# Patient Record
Sex: Female | Born: 1955
Health system: Southern US, Community
[De-identification: ages and names within clinical notes are randomized; demographics above are authoritative.]

## PROBLEM LIST (undated history)

## (undated) DIAGNOSIS — G8929 Other chronic pain: Secondary | ICD-10-CM

## (undated) DIAGNOSIS — F32A Depression, unspecified: Secondary | ICD-10-CM

## (undated) DIAGNOSIS — Z98811 Dental restoration status: Secondary | ICD-10-CM

## (undated) DIAGNOSIS — Z8041 Family history of malignant neoplasm of ovary: Secondary | ICD-10-CM

## (undated) DIAGNOSIS — M545 Low back pain, unspecified: Secondary | ICD-10-CM

## (undated) DIAGNOSIS — F329 Major depressive disorder, single episode, unspecified: Secondary | ICD-10-CM

## (undated) DIAGNOSIS — M069 Rheumatoid arthritis, unspecified: Secondary | ICD-10-CM

## (undated) DIAGNOSIS — M25551 Pain in right hip: Secondary | ICD-10-CM

## (undated) DIAGNOSIS — C44310 Basal cell carcinoma of skin of unspecified parts of face: Secondary | ICD-10-CM

## (undated) DIAGNOSIS — R635 Abnormal weight gain: Secondary | ICD-10-CM

## (undated) DIAGNOSIS — Z9289 Personal history of other medical treatment: Secondary | ICD-10-CM

## (undated) DIAGNOSIS — Q782 Osteopetrosis: Secondary | ICD-10-CM

## (undated) DIAGNOSIS — M1990 Unspecified osteoarthritis, unspecified site: Secondary | ICD-10-CM

## (undated) DIAGNOSIS — M810 Age-related osteoporosis without current pathological fracture: Secondary | ICD-10-CM

## (undated) HISTORY — DX: Personal history of other medical treatment: Z92.89

## (undated) HISTORY — DX: Abnormal weight gain: R63.5

## (undated) HISTORY — DX: Osteopetrosis: Q78.2

## (undated) HISTORY — DX: Pain in right hip: M25.551

## (undated) HISTORY — DX: Age-related osteoporosis without current pathological fracture: M81.0

## (undated) HISTORY — PX: BASAL CELL CARCINOMA EXCISION: SHX1214

## (undated) HISTORY — DX: Rheumatoid arthritis, unspecified: M06.9

## (undated) HISTORY — DX: Depression, unspecified: F32.A

## (undated) HISTORY — DX: Family history of malignant neoplasm of ovary: Z80.41

## (undated) HISTORY — DX: Major depressive disorder, single episode, unspecified: F32.9

## (undated) HISTORY — PX: JOINT REPLACEMENT: SHX530

---

## 2006-03-22 ENCOUNTER — Emergency Department: Payer: Self-pay | Admitting: Emergency Medicine

## 2006-04-17 ENCOUNTER — Ambulatory Visit: Payer: Self-pay

## 2006-05-02 ENCOUNTER — Ambulatory Visit: Payer: Self-pay

## 2007-11-12 ENCOUNTER — Ambulatory Visit: Payer: Self-pay | Admitting: Gastroenterology

## 2008-03-30 IMAGING — CT CT STONE STUDY
1 of 2 series · 15 of 32 positions shown, 19 images · non-contrast
Comparison: none

REASON FOR EXAM: side pain
COMMENTS:

[Series 2: stone · axial · 0.69mm/px · z∈[-982,-607]mm · 15 of 141 slices shown, 19 images]
[im 11/141  soft-tissue]
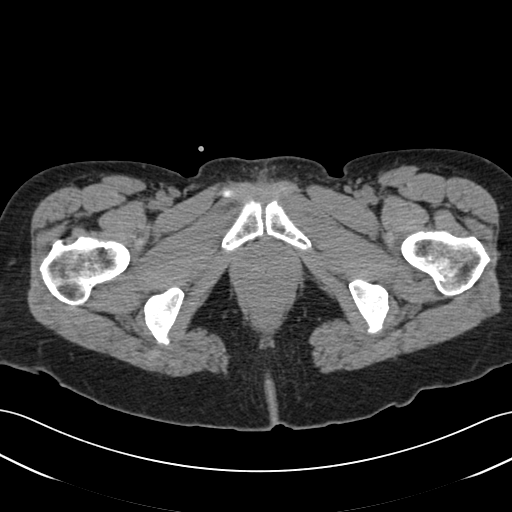
[im 11/141  bone]
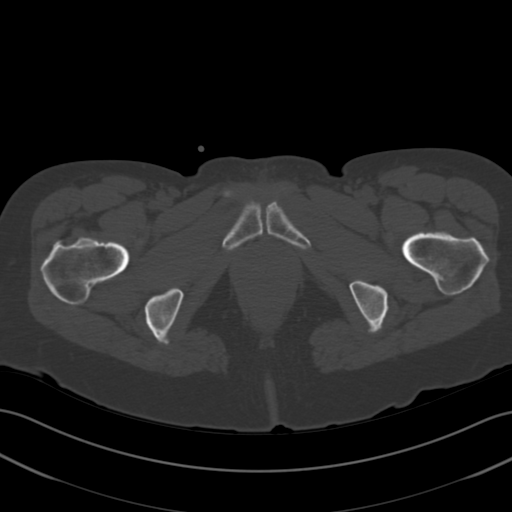
[im 21/141  soft-tissue]
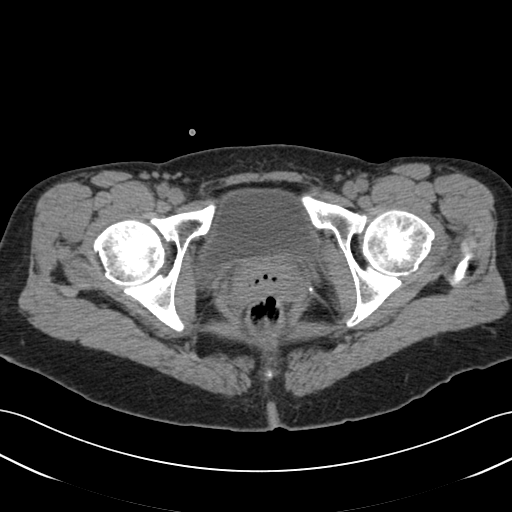
[im 31/141  soft-tissue]
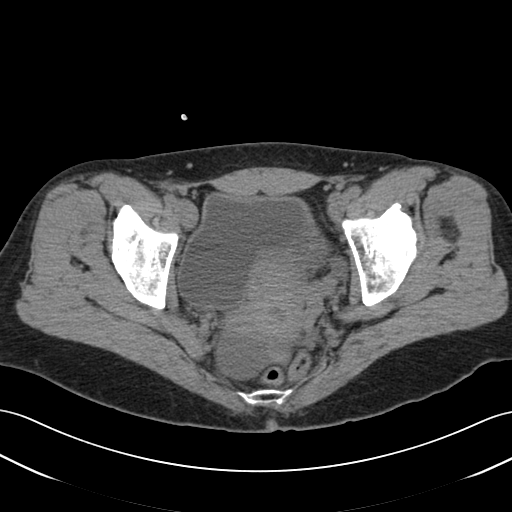
[im 41/141  soft-tissue]
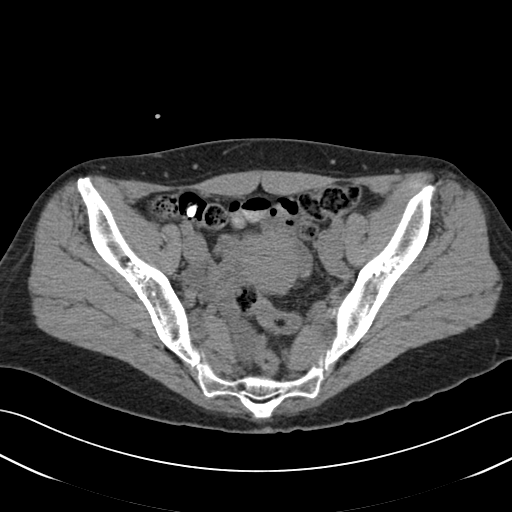
[im 51/141  soft-tissue]
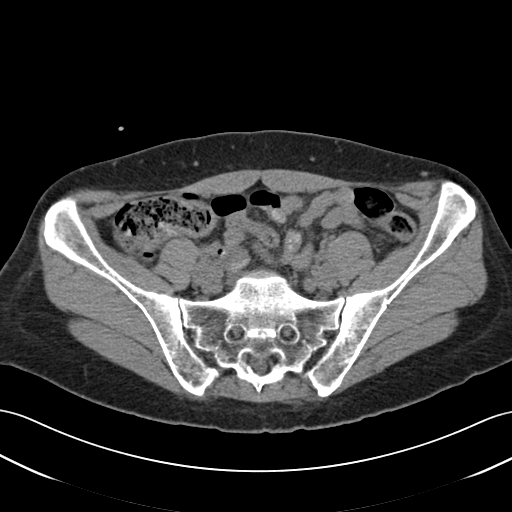
[im 61/141  soft-tissue]
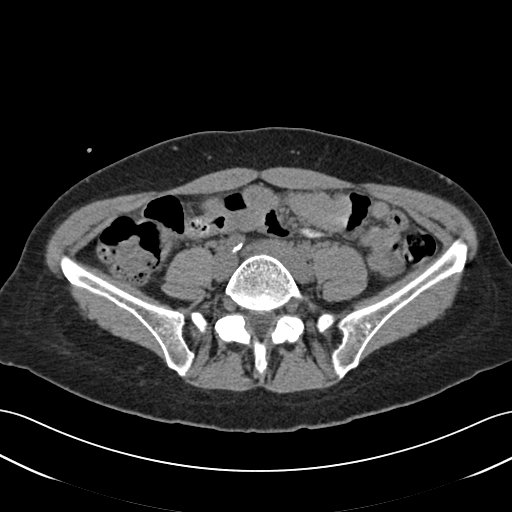
[im 71/141  soft-tissue]
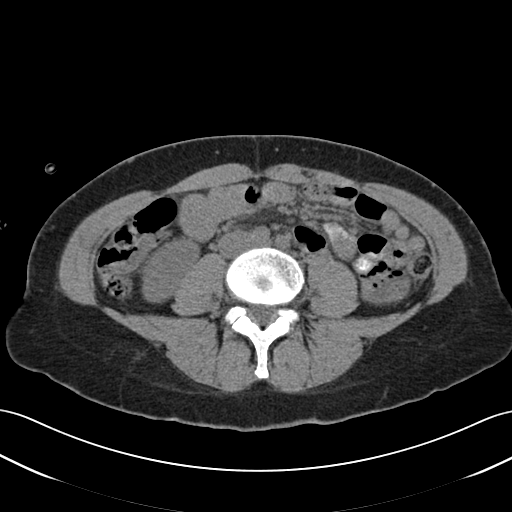
[im 81/141  soft-tissue]
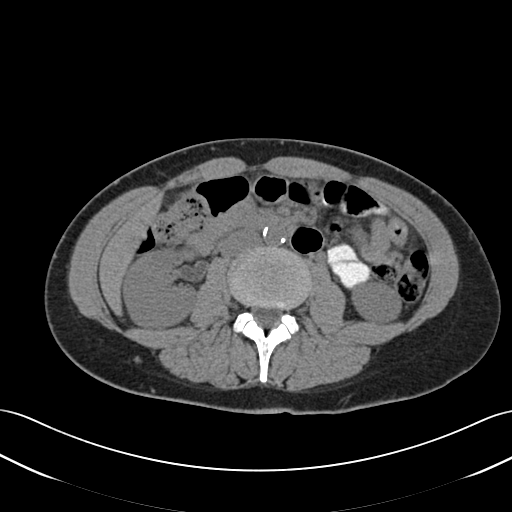
[im 91/141  soft-tissue]
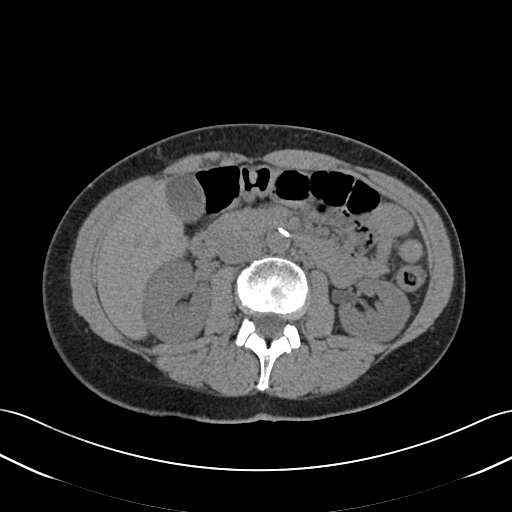
[im 91/141  bone]
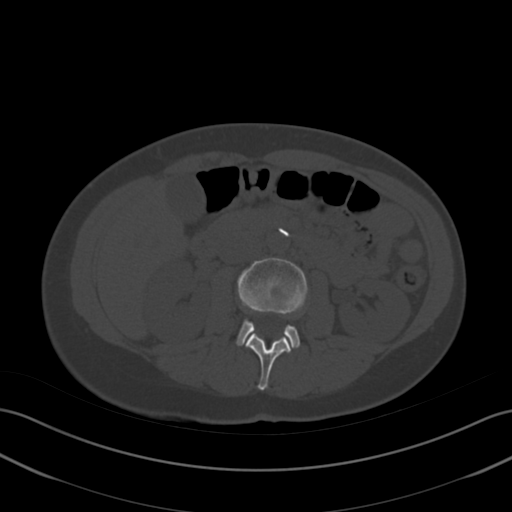
[im 101/141  soft-tissue]
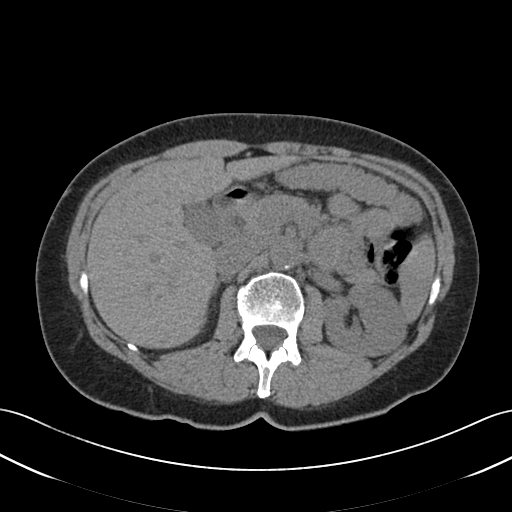
[im 111/141  soft-tissue]
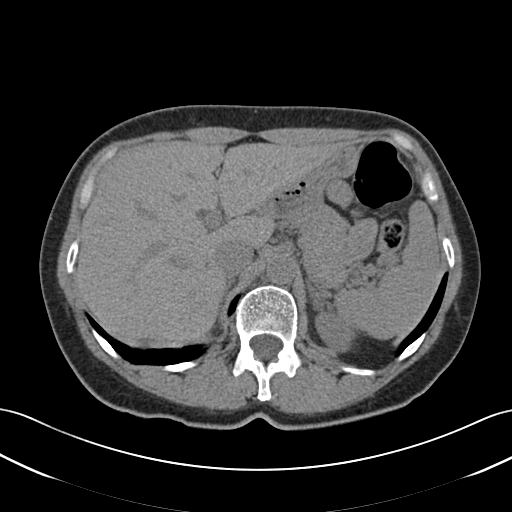
[im 121/141  soft-tissue]
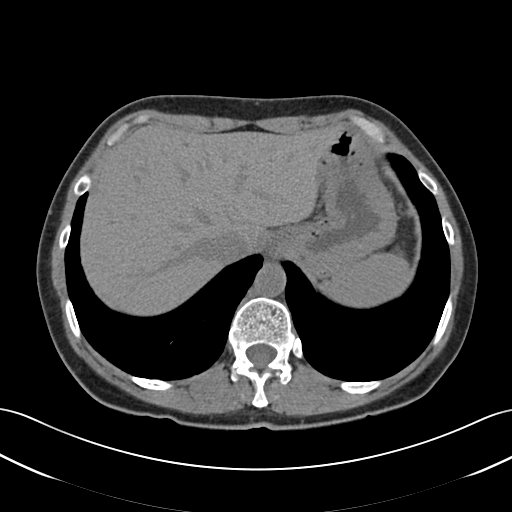
[im 121/141  lung]
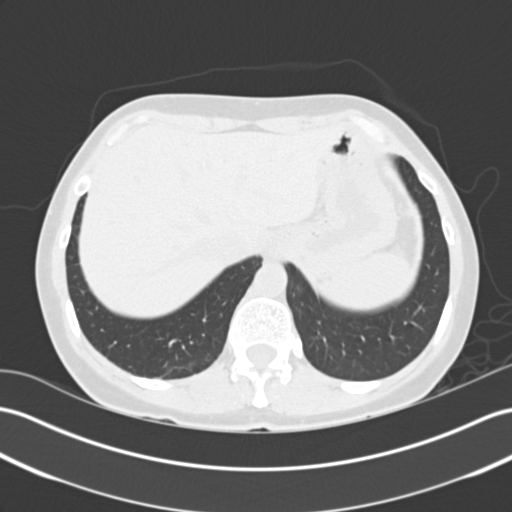
[im 126/141  lung]
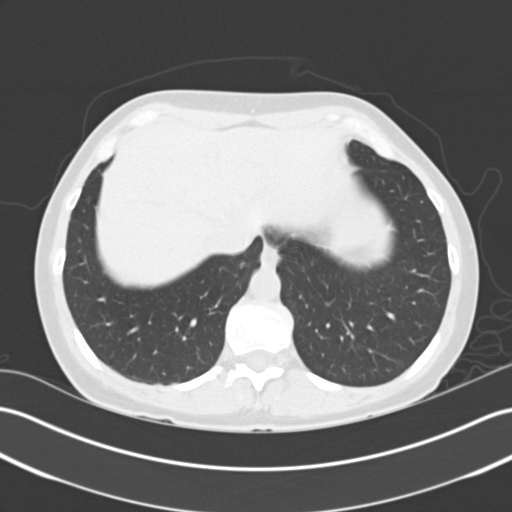
[im 131/141  soft-tissue]
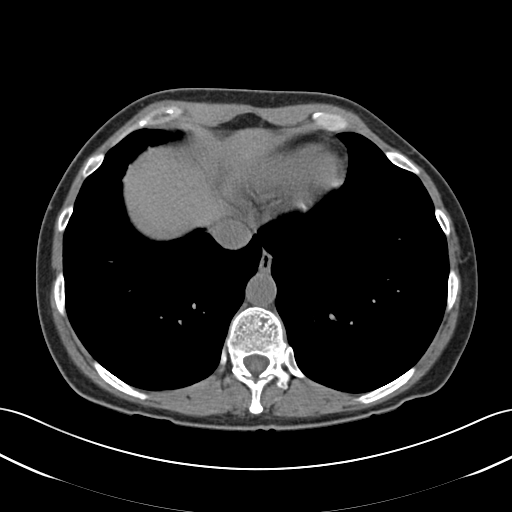
[im 131/141  lung]
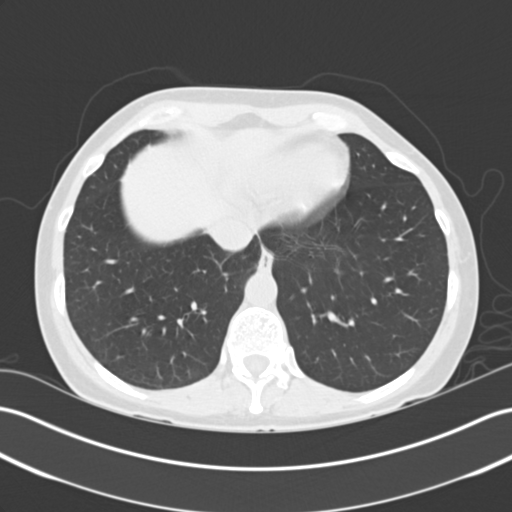
[im 136/141  lung]
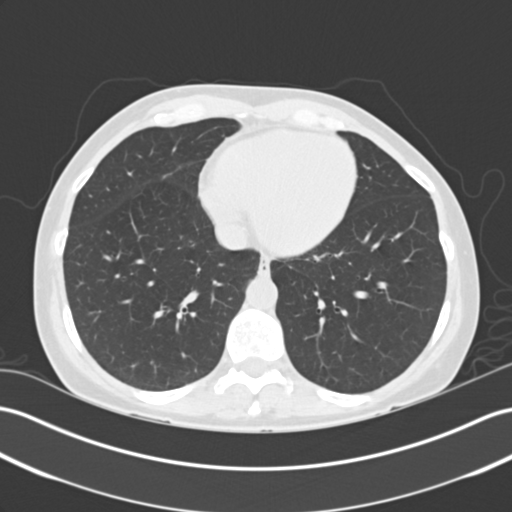

[15 of 32 positions shown; findings below may reference images not displayed]

PROCEDURE:     CT  - CT ABDOMEN /PELVIS WO (STONE)  - March 22, 2006  [DATE]

RESULT:          Stone protocol CT of the abdomen and pelvis is performed.

The lung window images show the lung bases are clear and appear to be fully
inflated.  There is no evidence of urinary tract obstruction.  No renal
calculi are seen.  There is some minimal atherosclerotic calcification in
the distal aorta and in the iliac arteries.  There appear to be some tablets
in loops of bowel in the RIGHT lower quadrant.  There is some free fluid
seen in the pelvic region.  The appendix is difficult to see but appears to
be grossly normal.  There is also some loculated low-density area in the
RIGHT adnexa and retrouterine area to the RIGHT which could represent
ovarian cysts or fluid collections.  Further evaluation with ultrasound
could be considered.  A discrete abscess is not definitely identified.
There is no free air or abnormal bowel distention.  The solid abdominal
viscera appear to be grossly normal.  No radiopaque gallstones are evident.
IMPRESSION: 1.     The study is limited by lack of IV and oral contrast.  There is no
discrete evidence of appendicitis.  There are some cystic-appearing
structures in the RIGHT adnexa and retrouterine region which could be
ovarian cysts or other fluid collections.  A discrete abscess is felt to be
less likely, but again further evaluation with pelvic ultrasound could be
considered.  If there is concern for a pelvic abscess, then IV and oral
contrast-enhanced CT would be suggested.
2.     No urinary tract stones are evident.  No hydronephrosis.

## 2008-05-31 ENCOUNTER — Ambulatory Visit: Payer: Self-pay

## 2010-07-30 ENCOUNTER — Ambulatory Visit: Payer: Self-pay

## 2010-07-30 DIAGNOSIS — Z9289 Personal history of other medical treatment: Secondary | ICD-10-CM

## 2010-07-30 HISTORY — DX: Personal history of other medical treatment: Z92.89

## 2013-11-23 ENCOUNTER — Ambulatory Visit: Payer: Self-pay | Admitting: Emergency Medicine

## 2013-11-29 DIAGNOSIS — M069 Rheumatoid arthritis, unspecified: Secondary | ICD-10-CM

## 2013-11-29 HISTORY — DX: Rheumatoid arthritis, unspecified: M06.9

## 2013-12-09 LAB — HM MAMMOGRAPHY

## 2013-12-09 LAB — HM PAP SMEAR: HM Pap smear: NEGATIVE

## 2014-01-03 DIAGNOSIS — Z8041 Family history of malignant neoplasm of ovary: Secondary | ICD-10-CM

## 2014-01-03 HISTORY — DX: Family history of malignant neoplasm of ovary: Z80.41

## 2015-07-02 DIAGNOSIS — M810 Age-related osteoporosis without current pathological fracture: Secondary | ICD-10-CM

## 2015-07-02 HISTORY — DX: Age-related osteoporosis without current pathological fracture: M81.0

## 2015-07-10 ENCOUNTER — Emergency Department
Admission: EM | Admit: 2015-07-10 | Discharge: 2015-07-10 | Disposition: A | Discharge status: Home / Self Care | Payer: BLUE CROSS/BLUE SHIELD | Attending: Emergency Medicine | Admitting: Emergency Medicine

## 2015-07-10 ENCOUNTER — Encounter: Payer: Self-pay | Admitting: Emergency Medicine

## 2015-07-10 ENCOUNTER — Emergency Department: Payer: BLUE CROSS/BLUE SHIELD

## 2015-07-10 DIAGNOSIS — Y998 Other external cause status: Secondary | ICD-10-CM | POA: Insufficient documentation

## 2015-07-10 DIAGNOSIS — W000XXA Fall on same level due to ice and snow, initial encounter: Secondary | ICD-10-CM | POA: Insufficient documentation

## 2015-07-10 DIAGNOSIS — S32018A Other fracture of first lumbar vertebra, initial encounter for closed fracture: Secondary | ICD-10-CM | POA: Insufficient documentation

## 2015-07-10 DIAGNOSIS — F17200 Nicotine dependence, unspecified, uncomplicated: Secondary | ICD-10-CM | POA: Insufficient documentation

## 2015-07-10 DIAGNOSIS — Z791 Long term (current) use of non-steroidal anti-inflammatories (NSAID): Secondary | ICD-10-CM | POA: Diagnosis not present

## 2015-07-10 DIAGNOSIS — S32000A Wedge compression fracture of unspecified lumbar vertebra, initial encounter for closed fracture: Secondary | ICD-10-CM

## 2015-07-10 DIAGNOSIS — Y9389 Activity, other specified: Secondary | ICD-10-CM | POA: Insufficient documentation

## 2015-07-10 DIAGNOSIS — Y92009 Unspecified place in unspecified non-institutional (private) residence as the place of occurrence of the external cause: Secondary | ICD-10-CM | POA: Diagnosis not present

## 2015-07-10 DIAGNOSIS — S3992XA Unspecified injury of lower back, initial encounter: Secondary | ICD-10-CM | POA: Diagnosis present

## 2015-07-10 HISTORY — DX: Unspecified osteoarthritis, unspecified site: M19.90

## 2015-07-10 MED ORDER — ONDANSETRON 4 MG PO TBDP: 4.0000 mg | ORAL_TABLET | Freq: Three times a day (TID) | ORAL | Status: DC | PRN

## 2015-07-10 MED ORDER — HYDROCODONE-ACETAMINOPHEN 5-325 MG PO TABS: 1.0000 | ORAL_TABLET | ORAL | Status: DC | PRN

## 2015-07-10 MED ORDER — DIAZEPAM 5 MG PO TABS: 5.0000 mg | ORAL_TABLET | Freq: Three times a day (TID) | ORAL | Status: AC | PRN

## 2015-07-10 MED ORDER — HYDROCODONE-ACETAMINOPHEN 5-325 MG PO TABS
2.0000 | ORAL_TABLET | Freq: Once | ORAL | Status: AC
  Administered 2015-07-10: 2 via ORAL
  Filled 2015-07-10: qty 2

## 2015-07-10 NOTE — ED Notes (Signed)
Pt to ed with c/o lower back pain post fall, on Sat,  Pt states she had ice on her feet and slipped and fell inside her house.

## 2015-07-10 NOTE — Discharge Instructions (Signed)
Lumbar Fracture A lumbar fracture is a break in one of the bones of the lower back. Lumbar fractures range in severity. Severe fractures can damage the spinal cord. CAUSES This condition may be caused by:  A fall (common).  A car accident (common).  A gunshot wound.  A hard, direct hit to the back.  Osteoporosis. SYMPTOMS The main symptom of this condition is severe pain in the lower back. If a fracture is complex or severe, there may also be:  A misshapen or swollen area on the lower back.  A limited ability to move an area of the lower back.  An inability to empty the bladder or bowel.  A loss of strength or sensation in the legs, feet, and toes.  Paralysis. DIAGNOSIS This condition is diagnosed based on:  A physical exam.  Symptoms and what happened just before they developed.  The results of imaging tests, such as an X-ray, CT scan, or MRI. If your nerves have been damaged, you may also have other tests to find out how much damage there is. TREATMENT Treatment for this condition depends on the specifics of the injury. Most fractures can be treated with:  A back brace.  Bed rest and activity restrictions.  Pain medicine.  Physical therapy. Fractures that are complex, involve multiple bones, or make the spine unstable may require surgery to remove pressure from the nerves or spinal cord and to stabilize the broken pieces of bone. During recovery, it is normal to have pain and stiffness in the back for weeks. HOME CARE INSTRUCTIONS Medicines  Take medicines only as directed by your health care provider.  Do not drive or operate heavy machinery while taking pain medicine. Activity  Stay in bed for as long as directed by your health care provider.  If you were shown how to do any exercises to improve motion and strength in your back, do them as directed by your health care provider.  Return to your normal activities as directed by your health care provider.  Ask your health care provider what activities are safe for you. General Instructions  If you were given a neck brace or back brace, wear it as directed by your health care provider.  Keep all follow-up visits as directed by your health care provider. This is important. Failure to follow-up as recommended could result in permanent injury, disability, and long-lasting (chronic) pain. SEEK MEDICAL CARE IF:  Your pain does not improve over time.  You have a persistent cough.  You cannot return to your normal activities as planned or expected. SEEK IMMEDIATE MEDICAL CARE IF:  You have severe pain or your pain suddenly gets worse.  You are unable to move.  You have numbness, tingling, weakness, or paralysis in any part of your body.  You cannot control your bladder or bowel.  You have difficulty breathing.  You have a fever.  You have pain in your chest or abdomen.  You vomit.   This information is not intended to replace advice given to you by your health care provider. Make sure you discuss any questions you have with your health care provider.   Document Released: 10/02/2006 Document Revised: 11/01/2014 Document Reviewed: 06/13/2014 Elsevier Interactive Patient Education 2016 [employer].   Call orthopedist for further evaluation of your compression fracture. MRI may give more information. Norco one or 2 every 4 hours as needed for pain, Zofran as needed for nausea, and diazepam every 8 hours as needed for muscle spasms.

## 2015-07-10 NOTE — ED Provider Notes (Signed)
[facility] Emergency Department Provider Note  ____________________________________________  Time seen: Approximately 9:53 AM  I have reviewed the triage vital signs and the nursing notes.   HISTORY  Chief Complaint Fall  HPI Erin Donovan is a 60 y.o. female is here complaining of low back pain. Patient states she had ice on her feet and slipped inside her house and fell. She is continued to have pain in her lower back since Saturday.Patient denies any previous back injuries other than a pulled muscle. She denies any head injury or loss consciousness during this fall. She states the only place that she has continued to hurt is her lower back. She denies any paresthesias into her lower extremities. Currently she rates her pain an 8 out of 10.   Past Medical History  Diagnosis Date  . Osteoarthritis     There are no active problems to display for this patient.   History reviewed. No pertinent past surgical history.  Current Outpatient Rx  Name  Route  Sig  Dispense  Refill  . meloxicam (MOBIC) 15 MG tablet   Oral   Take 15 mg by mouth daily.         . diazepam (VALIUM) 5 MG tablet   Oral   Take 1 tablet (5 mg total) by mouth every 8 (eight) hours as needed for muscle spasms.   30 tablet   0   . HYDROcodone-acetaminophen (NORCO/VICODIN) 5-325 MG tablet   Oral   Take 1-2 tablets by mouth every 4 (four) hours as needed for moderate pain or severe pain.   30 tablet   0   . ondansetron (ZOFRAN ODT) 4 MG disintegrating tablet   Oral   Take 1 tablet (4 mg total) by mouth every 8 (eight) hours as needed for nausea or vomiting.   15 tablet   0     Allergies Review of patient's allergies indicates no known allergies.  History reviewed. No pertinent family history.  Social History Social History  Substance Use Topics  . Smoking status: Current Every Day Smoker  . Smokeless tobacco: None  . Alcohol Use: No    Review of  Systems Constitutional: No fever/chills Eyes: No visual changes. ENT: No trauma Cardiovascular: Denies chest pain. Respiratory: Denies shortness of breath. Gastrointestinal: No abdominal pain.  Positive nausea, no vomiting.   Genitourinary: Negative for dysuria. Musculoskeletal: Positive for low back pain. Skin: Negative for rash. Neurological: Negative for headaches, focal weakness or numbness.  10-point ROS otherwise negative.  ____________________________________________   PHYSICAL EXAM:  VITAL SIGNS: ED Triage Vitals  Enc Vitals Group     BP 07/10/15 0904 120/71 mmHg     Pulse Rate 07/10/15 0904 89     Resp 07/10/15 0904 20     Temp 07/10/15 0904 97.5 F (36.4 C)     Temp Source 07/10/15 0904 Oral     SpO2 07/10/15 0904 98 %     Weight 07/10/15 0904 160 lb (72.576 kg)     Height 07/10/15 0904 5\' 6"  (1.676 m)     Head Cir --      Peak Flow --      Pain Score 07/10/15 0858 8     Pain Loc --      Pain Edu? --      Excl. in [place]? --     Constitutional: Alert and oriented. Well appearing and in no acute distress. Eyes: Conjunctivae are normal. PERRL. EOMI. Head: Atraumatic. Nose: No congestion/rhinnorhea. Neck: No  stridor.  Nontender cervical spine and posterior palpation. Range of motion is without restriction. Cardiovascular: Normal rate, regular rhythm. Grossly normal heart sounds.  Good peripheral circulation. Respiratory: Normal respiratory effort.  No retractions. Lungs CTAB. Gastrointestinal: Soft and nontender. No distention.  Musculoskeletal: Examination of the back there is moderate tenderness on palpation of the lumbar spine particularly L1-3 area and left paravertebral muscle tenderness. Patient also has some left sacral tenderness without any soft tissue edema present. Range of motion is guarded secondary to patient's pain. Patient moves upper and lower extremities without any difficulty. Neurologic:  Normal speech and language. No gross focal neurologic  deficits are appreciated. No gait instability. Skin:  Skin is warm, dry and intact. No Abrasions or ecchymosis is noted on the back. Psychiatric: Mood and affect are normal. Speech and behavior are normal.  ____________________________________________   LABS (all labs ordered are listed, but only abnormal results are displayed)  Labs Reviewed - No data to display  RADIOLOGY  X-ray of the pelvis per radiologist shows no fracture or subluxation. Lumbar spine shows L1 compression fracture, age undetermined. Less than 50% height loss. Diffuse degenerative changes. ____________________________________________   PROCEDURES  Procedure(s) performed: None  Critical Care performed: No  ____________________________________________   INITIAL IMPRESSION / ASSESSMENT AND PLAN / ED COURSE  Pertinent labs & imaging results that were available during my care of the patient were reviewed by me and considered in my medical decision making (see chart for details).  Patient is unaware of any previous compression fracture. Patient had some nausea with taking Norco in the emergency room and is now experiencing some muscle spasms. Patient was discharged on diazepam 5 mg every 8 hours as needed for muscle spasms, Norco one or 2 every 4-6 hours if needed for pain and safer and ODT 4 mg every 6 hours as needed for nausea. Patient was referred to Dr. Sabra Heck however she is a patient at [facility] in [place] and most likely will make an appointment there. ____________________________________________   FINAL CLINICAL IMPRESSION(S) / ED DIAGNOSES  Final diagnoses:  Traumatic compression fracture of lumbar vertebra, closed, initial encounter [facility])      Johnn Hai, PA-C 07/10/15 1207  Lavonia Drafts, MD 07/10/15 1427

## 2016-07-10 DIAGNOSIS — E559 Vitamin D deficiency, unspecified: Secondary | ICD-10-CM | POA: Diagnosis not present

## 2016-07-10 DIAGNOSIS — M810 Age-related osteoporosis without current pathological fracture: Secondary | ICD-10-CM | POA: Diagnosis not present

## 2016-07-10 DIAGNOSIS — R5383 Other fatigue: Secondary | ICD-10-CM | POA: Diagnosis not present

## 2016-07-10 DIAGNOSIS — M1611 Unilateral primary osteoarthritis, right hip: Secondary | ICD-10-CM | POA: Diagnosis not present

## 2016-07-24 DIAGNOSIS — M1611 Unilateral primary osteoarthritis, right hip: Secondary | ICD-10-CM | POA: Diagnosis not present

## 2016-07-29 DIAGNOSIS — M1611 Unilateral primary osteoarthritis, right hip: Secondary | ICD-10-CM | POA: Diagnosis not present

## 2016-07-31 ENCOUNTER — Ambulatory Visit (INDEPENDENT_AMBULATORY_CARE_PROVIDER_SITE_OTHER): Payer: BLUE CROSS/BLUE SHIELD | Admitting: Family Medicine

## 2016-07-31 ENCOUNTER — Encounter: Payer: Self-pay | Admitting: Family Medicine

## 2016-07-31 ENCOUNTER — Other Ambulatory Visit: Payer: Self-pay | Admitting: Family Medicine

## 2016-07-31 VITALS — BP 132/82 | HR 76 | Resp 16 | Ht 65.0 in | Wt 176.0 lb

## 2016-07-31 DIAGNOSIS — Z23 Encounter for immunization: Secondary | ICD-10-CM | POA: Diagnosis not present

## 2016-07-31 DIAGNOSIS — E663 Overweight: Secondary | ICD-10-CM

## 2016-07-31 DIAGNOSIS — F17200 Nicotine dependence, unspecified, uncomplicated: Secondary | ICD-10-CM | POA: Insufficient documentation

## 2016-07-31 DIAGNOSIS — M810 Age-related osteoporosis without current pathological fracture: Secondary | ICD-10-CM

## 2016-07-31 DIAGNOSIS — Z8781 Personal history of (healed) traumatic fracture: Secondary | ICD-10-CM

## 2016-07-31 DIAGNOSIS — E66811 Obesity, class 1: Secondary | ICD-10-CM | POA: Insufficient documentation

## 2016-07-31 DIAGNOSIS — Z0000 Encounter for general adult medical examination without abnormal findings: Secondary | ICD-10-CM | POA: Diagnosis not present

## 2016-07-31 DIAGNOSIS — M1611 Unilateral primary osteoarthritis, right hip: Secondary | ICD-10-CM

## 2016-07-31 DIAGNOSIS — E669 Obesity, unspecified: Secondary | ICD-10-CM | POA: Insufficient documentation

## 2016-07-31 MED ORDER — VITAMIN D3 125 MCG (5000 UT) PO CAPS: 1.0000 | ORAL_CAPSULE | Freq: Every day | ORAL | Status: AC

## 2016-07-31 MED ORDER — BIOTIN 10000 MCG PO TABS: 1.0000 | ORAL_TABLET | Freq: Every day | ORAL | Status: DC

## 2016-07-31 MED ORDER — MAGNESIUM OXIDE 400 MG PO TABS: 400.0000 mg | ORAL_TABLET | Freq: Every day | ORAL | Status: DC

## 2016-07-31 MED ORDER — CALCIUM CARBONATE 1250 (500 CA) MG PO TABS: 1.0000 | ORAL_TABLET | Freq: Every day | ORAL | Status: DC

## 2016-07-31 NOTE — Progress Notes (Signed)
Vitamin d

## 2016-07-31 NOTE — Progress Notes (Signed)
Date:  07/31/2016   Name:  Erin Donovan   DOB:  [birth year]   MRN:  [id number]  PCP:  No PCP Per Patient    Chief Complaint: Establish Care and Medical Clearance (Surgery not scheduled yet Right Hip Replacement. )   History of Present Illness:  This is a 61 y.o. female seen for initial visit. She is planning a R THR and needs medical clearance. No prior surgeries. Hx spinal comp fx s/p fall one year ago, DEXA showed osteoporosis per pt report, started on Forteo in April. On vit D and Ca supplements. Takes Mobic daily and Norco rarely for hip pain. Also takes mg oxide and biotin supps and multivit. No other medical problems. Father died age 38 MI, mother died age 60 CHF, had osteoporosis. Sisters with hx Crohn dz, factor V Leiden, CVA, asthma, lupus, and endometrial ca. Menopause age 53. Had flu imm, last tet imm 2001. Colonoscopy 10 yrs ago ok, mammo 2016 ok  Has gradually gained weight over past 10 yrs, no regular exercise.  Review of Systems:  Review of Systems  Constitutional: Negative for chills, fatigue and fever.  HENT: Negative for ear pain and trouble swallowing.   Eyes: Negative for pain.  Respiratory: Negative for cough and shortness of breath.   Cardiovascular: Negative for chest pain, palpitations and leg swelling.  Gastrointestinal: Negative for abdominal pain.  Endocrine: Negative for polydipsia and polyuria.  Genitourinary: Negative for dysuria, pelvic pain and vaginal bleeding.  Musculoskeletal: Negative for myalgias.  Skin: Negative for rash.  Neurological: Negative for tremors, syncope and light-headedness.  Hematological: Negative for adenopathy.  Psychiatric/Behavioral: Negative for agitation and confusion.    Patient Active Problem List   Diagnosis Date Noted  . Osteoarthritis of right hip 07/31/2016  . Age-related osteoporosis without current pathological fracture 07/31/2016  . Hx of compression fracture of spine 07/31/2016  . Overweight (BMI 25.0-29.9)  07/31/2016  . Smoker 07/31/2016    Prior to Admission medications   Medication Sig Start Date End Date Taking? Authorizing Provider  HYDROcodone-acetaminophen (NORCO) 7.5-325 MG tablet Take 1 tablet by mouth every 6 (six) hours as needed for moderate pain.   Yes Historical Provider, MD  meloxicam (MOBIC) 15 MG tablet Take 15 mg by mouth daily.   Yes Historical Provider, MD  Teriparatide, Recombinant, (FORTEO [place]) Inject 20 mg into the skin.   Yes Historical Provider, MD    No Known Allergies  History reviewed. No pertinent surgical history.  Social History  Substance Use Topics  . Smoking status: Current Every Day Smoker    Packs/day: 0.50    Types: Cigarettes  . Smokeless tobacco: Never Used  . Alcohol use No    History reviewed. No pertinent family history.  Medication list has been reviewed and updated.  Physical Examination: BP 132/82   Pulse 76   Resp 16   Ht 5\' 5"  (1.651 m)   Wt 176 lb (79.8 kg)   SpO2 97%   BMI 29.29 kg/m   Physical Exam  Constitutional: She is oriented to person, place, and time. She appears well-developed and well-nourished.  HENT:  Head: Atraumatic.  Right Ear: External ear normal.  Left Ear: External ear normal.  Nose: Nose normal.  Mouth/Throat: Oropharynx is clear and moist.  TM's clear  Eyes: Conjunctivae and EOM are normal. Pupils are equal, round, and reactive to light. No scleral icterus.  Neck: Neck supple. No thyromegaly present.  Cardiovascular: Normal rate, regular rhythm, normal heart sounds and intact distal pulses.  Pulmonary/Chest: Effort normal and breath sounds normal. She has no wheezes. She has no rales.  Abdominal: Soft. She exhibits no distension and no mass. There is no guarding.  Musculoskeletal: She exhibits no edema or deformity.  Lymphadenopathy:    She has no cervical adenopathy.  Neurological: She is alert and oriented to person, place, and time. Coordination normal.  Romberg negative, gait normal  Skin:  Skin is warm and dry.  Psychiatric: She has a normal mood and affect. Her behavior is normal.  Nursing note and vitals reviewed.   Assessment and Plan:  1. Primary osteoarthritis of right hip Ok for [facility] pending labs - Comprehensive Metabolic Panel (CMET) - CBC  2. Age-related osteoporosis without current pathological fracture On Forteo/Ca/vit D - Vitamin D (25 hydroxy)  3. Overweight (BMI 25.0-29.9) Exercise/weight loss discussed - Lipid Profile - TSH  4. Smoker Strongly advised cessation prior to surgery  5. Hx of compression fracture of spine  6. Need for diphtheria-tetanus-pertussis (Tdap) vaccine Tdap today  7. Healthcare maintenance Consider repeat colonoscopy, mammogram next visit  Return in about 4 weeks (around 08/28/2016).   45 minutes spent with patient, over half in counseling  Satira Anis. [facility]  07/31/2016

## 2016-07-31 NOTE — Patient Instructions (Signed)
Tdap Vaccine (Tetanus, Diphtheria and Pertussis): What You Need to Know 1. Why get vaccinated? Tetanus, diphtheria and pertussis are very serious diseases. Tdap vaccine can protect us from these diseases. And, Tdap vaccine given to pregnant women can protect newborn babies against pertussis. TETANUS (Lockjaw) is rare in the [place] today. It causes painful muscle tightening and stiffness, usually all over the body.  It can lead to tightening of muscles in the head and neck so you can't open your mouth, swallow, or sometimes even breathe. Tetanus kills about 1 out of 10 people who are infected even after receiving the best medical care.  DIPHTHERIA is also rare in the [place] today. It can cause a thick coating to form in the back of the throat.  It can lead to breathing problems, heart failure, paralysis, and death.  PERTUSSIS (Whooping Cough) causes severe coughing spells, which can cause difficulty breathing, vomiting and disturbed sleep.  It can also lead to weight loss, incontinence, and rib fractures. Up to 2 in 100 adolescents and 5 in 100 adults with pertussis are hospitalized or have complications, which could include pneumonia or death.  These diseases are caused by bacteria. Diphtheria and pertussis are spread from person to person through secretions from coughing or sneezing. Tetanus enters the body through cuts, scratches, or wounds. Before vaccines, as many as 200,000 cases of diphtheria, 200,000 cases of pertussis, and hundreds of cases of tetanus, were reported in the [place] each year. Since vaccination began, reports of cases for tetanus and diphtheria have dropped by about 99% and for pertussis by about 80%. 2. Tdap vaccine Tdap vaccine can protect adolescents and adults from tetanus, diphtheria, and pertussis. One dose of Tdap is routinely given at age 11 or 12. People who did not get Tdap at that age should get it as soon as possible. Tdap is especially  important for healthcare professionals and anyone having close contact with a baby younger than 12 months. Pregnant women should get a dose of Tdap during every pregnancy, to protect the newborn from pertussis. Infants are most at risk for severe, life-threatening complications from pertussis. Another vaccine, called Td, protects against tetanus and diphtheria, but not pertussis. A Td booster should be given every 10 years. Tdap may be given as one of these boosters if you have never gotten Tdap before. Tdap may also be given after a severe cut or burn to prevent tetanus infection. Your doctor or the person giving you the vaccine can give you more information. Tdap may safely be given at the same time as other vaccines. 3. Some people should not get this vaccine  A person who has ever had a life-threatening allergic reaction after a previous dose of any diphtheria, tetanus or pertussis containing vaccine, OR has a severe allergy to any part of this vaccine, should not get Tdap vaccine. Tell the person giving the vaccine about any severe allergies.  Anyone who had coma or long repeated seizures within 7 days after a childhood dose of DTP or DTaP, or a previous dose of Tdap, should not get Tdap, unless a cause other than the vaccine was found. They can still get Td.  Talk to your doctor if you: ? have seizures or another nervous system problem, ? had severe pain or swelling after any vaccine containing diphtheria, tetanus or pertussis, ? ever had a condition called Guillain-Barr Syndrome (GBS), ? aren't feeling well on the day the shot is scheduled. 4. Risks With any medicine, including   vaccines, there is a chance of side effects. These are usually mild and go away on their own. Serious reactions are also possible but are rare. Most people who get Tdap vaccine do not have any problems with it. Mild problems following Tdap: (Did not interfere with activities)  Pain where the shot was given (about  3 in 4 adolescents or 2 in 3 adults)  Redness or swelling where the shot was given (about 1 person in 5)  Mild fever of at least 100.4F (up to about 1 in 25 adolescents or 1 in 100 adults)  Headache (about 3 or 4 people in 10)  Tiredness (about 1 person in 3 or 4)  Nausea, vomiting, diarrhea, stomach ache (up to 1 in 4 adolescents or 1 in 10 adults)  Chills, sore joints (about 1 person in 10)  Body aches (about 1 person in 3 or 4)  Rash, swollen glands (uncommon)  Moderate problems following Tdap: (Interfered with activities, but did not require medical attention)  Pain where the shot was given (up to 1 in 5 or 6)  Redness or swelling where the shot was given (up to about 1 in 16 adolescents or 1 in 12 adults)  Fever over 102F (about 1 in 100 adolescents or 1 in 250 adults)  Headache (about 1 in 7 adolescents or 1 in 10 adults)  Nausea, vomiting, diarrhea, stomach ache (up to 1 or 3 people in 100)  Swelling of the entire arm where the shot was given (up to about 1 in 500).  Severe problems following Tdap: (Unable to perform usual activities; required medical attention)  Swelling, severe pain, bleeding and redness in the arm where the shot was given (rare).  Problems that could happen after any vaccine:  People sometimes faint after a medical procedure, including vaccination. Sitting or lying down for about 15 minutes can help prevent fainting, and injuries caused by a fall. Tell your doctor if you feel dizzy, or have vision changes or ringing in the ears.  Some people get severe pain in the shoulder and have difficulty moving the arm where a shot was given. This happens very rarely.  Any medication can cause a severe allergic reaction. Such reactions from a vaccine are very rare, estimated at fewer than 1 in a million doses, and would happen within a few minutes to a few hours after the vaccination. As with any medicine, there is a very remote chance of a vaccine  causing a serious injury or death. The safety of vaccines is always being monitored. For more information, visit: www.cdc.gov/vaccinesafety/ 5. What if there is a serious problem? What should I look for? Look for anything that concerns you, such as signs of a severe allergic reaction, very high fever, or unusual behavior. Signs of a severe allergic reaction can include hives, swelling of the face and throat, difficulty breathing, a fast heartbeat, dizziness, and weakness. These would usually start a few minutes to a few hours after the vaccination. What should I do?  If you think it is a severe allergic reaction or other emergency that can't wait, call 9-1-1 or get the person to the nearest hospital. Otherwise, call your doctor.  Afterward, the reaction should be reported to the Vaccine Adverse Event Reporting System (VAERS). Your doctor might file this report, or you can do it yourself through the VAERS web site at www.vaers.hhs.gov, or by calling 1-800-822-7967. ? VAERS does not give medical advice. 6. The National Vaccine Injury Compensation Program The National   Vaccine Injury Compensation Program (VICP) is a federal program that was created to compensate people who may have been injured by certain vaccines. Persons who believe they may have been injured by a vaccine can learn about the program and about filing a claim by calling 1-800-338-2382 or visiting the VICP website at www.hrsa.gov/vaccinecompensation. There is a time limit to file a claim for compensation. 7. How can I learn more?  Ask your doctor. He or she can give you the vaccine package insert or suggest other sources of information.  Call your local or state health department.  Contact the Centers for Disease Control and Prevention (CDC): ? Call 1-800-232-4636 (1-800-CDC-INFO) or ? Visit CDC's website at www.cdc.gov/vaccines CDC Tdap Vaccine VIS (08/24/13) This information is not intended to replace advice given to you by your  health care provider. Make sure you discuss any questions you have with your health care provider. Document Released: 12/17/2011 Document Revised: 03/07/2016 Document Reviewed: 03/07/2016 Elsevier Interactive Patient Education  2017 Elsevier Inc.  

## 2016-08-01 LAB — LIPID PANEL
CHOL/HDL RATIO: 3.4 ratio (ref 0.0–4.4)
Cholesterol, Total: 198 mg/dL (ref 100–199)
HDL: 58 mg/dL (ref >39)
LDL CALC: 118 mg/dL — AB (ref 0–99)
TRIGLYCERIDES: 110 mg/dL (ref 0–149)
VLDL Cholesterol Cal: 22 mg/dL (ref 5–40)

## 2016-08-01 LAB — COMPREHENSIVE METABOLIC PANEL
ALBUMIN: 4.3 g/dL (ref 3.5–5.5)
ALT: 15 IU/L (ref 0–32)
AST: 18 IU/L (ref 0–40)
Albumin/Globulin Ratio: 1.9 (ref 1.2–2.2)
Alkaline Phosphatase: 117 IU/L (ref 39–117)
BILIRUBIN TOTAL: 0.3 mg/dL (ref 0.0–1.2)
BUN / CREAT RATIO: 20 (ref 9–23)
BUN: 16 mg/dL (ref 6–24)
CALCIUM: 9.4 mg/dL (ref 8.7–10.2)
CO2: 22 mmol/L (ref 18–29)
CREATININE: 0.79 mg/dL (ref 0.57–1.00)
Chloride: 101 mmol/L (ref 96–106)
GFR calc Af Amer: 95 mL/min/{1.73_m2} (ref >59)
GFR, EST NON AFRICAN AMERICAN: 82 mL/min/{1.73_m2} (ref >59)
GLUCOSE: 83 mg/dL (ref 65–99)
Globulin, Total: 2.3 g/dL (ref 1.5–4.5)
Potassium: 4.8 mmol/L (ref 3.5–5.2)
Sodium: 141 mmol/L (ref 134–144)
TOTAL PROTEIN: 6.6 g/dL (ref 6.0–8.5)

## 2016-08-01 LAB — CBC
HEMOGLOBIN: 13.8 g/dL (ref 11.1–15.9)
Hematocrit: 41.7 % (ref 34.0–46.6)
MCH: 30.2 pg (ref 26.6–33.0)
MCHC: 33.1 g/dL (ref 31.5–35.7)
MCV: 91 fL (ref 79–97)
Platelets: 313 10*3/uL (ref 150–379)
RBC: 4.57 x10E6/uL (ref 3.77–5.28)
RDW: 14 % (ref 12.3–15.4)
WBC: 7.9 10*3/uL (ref 3.4–10.8)

## 2016-08-01 LAB — TSH: TSH: 0.627 u[IU]/mL (ref 0.450–4.500)

## 2016-08-01 LAB — VITAMIN D 25 HYDROXY (VIT D DEFICIENCY, FRACTURES): VIT D 25 HYDROXY: 47 ng/mL (ref 30.0–100.0)

## 2016-08-26 DIAGNOSIS — M1611 Unilateral primary osteoarthritis, right hip: Secondary | ICD-10-CM | POA: Diagnosis not present

## 2016-08-26 NOTE — H&P (Signed)
PREOPERATIVE H&P Patient ID: ORIELLE COCKEY MRN: [id number] DOB/AGE: August 30, 1955 61 y.o.  Chief Complaint: OA RIGHT HIP  Planned Procedure Date: 09/17/16 Medical and Cardiac Clearance by Dr. Vicente Masson    HPI: Erin Donovan is a 61 y.o. female smoker with a history of L1 compression fx 07/2015 who presents for evaluation of OA RIGHT HIP. The patient has a history of pain and functional disability in the right hip due to arthritis and has failed non-surgical conservative treatments for greater than 12 weeks to include NSAID's and/or analgesics and activity modification.  Onset of symptoms was gradual, starting 3 years ago with gradually worsening course since that time.  Patient currently rates pain at 10 out of 10 with activity. Patient has night pain, worsening of pain with activity and weight bearing and pain with passive range of motion.  Patient has evidence of subchondral cysts, subchondral sclerosis, periarticular osteophytes and joint space narrowing by imaging studies. There is no active infection.  Past Medical History:  Diagnosis Date  . Osteoarthritis   . Osteosclerosis   . Right hip pain    No past surgical history . No Known Allergies   Prior to Admission medications   Medication Sig Start Date End Date Taking? Authorizing Provider  Biotin 10000 MCG TABS Take 1 tablet by mouth daily. 07/31/16   Adline Potter, MD  calcium carbonate (OS-CAL - DOSED IN MG OF ELEMENTAL CALCIUM) 1250 (500 Ca) MG tablet Take 1 tablet (500 mg of elemental calcium total) by mouth daily with breakfast. 07/31/16   Adline Potter, MD  Cholecalciferol (VITAMIN D3) 5000 units CAPS Take 1 capsule (5,000 Units total) by mouth daily. 07/31/16   Adline Potter, MD  HYDROcodone-acetaminophen (NORCO) 7.5-325 MG tablet Take 1 tablet by mouth every 6 (six) hours as needed for moderate pain.    Historical Provider, MD  magnesium oxide (MAG-OX) 400 MG tablet Take 1 tablet (400 mg total) by mouth daily. 07/31/16   Adline Potter, MD  meloxicam (MOBIC) 15 MG tablet Take 15 mg by mouth daily.    Historical Provider, MD  Teriparatide, Recombinant, (FORTEO [place]) Inject 20 mg into the skin.    Historical Provider, MD   Social History   Social History  . Marital status: Married    Spouse name: N/A  . Number of children: N/A  . Years of education: N/A   Social History Main Topics  . Smoking status: Current Every Day Smoker    Packs/day: 0.50    Types: Cigarettes  . Smokeless tobacco: Never Used  . Alcohol use No  . Drug use: No  . Sexual activity: Not on file   Other Topics Concern  . Not on file   Social History Narrative  . No narrative on file   Family History: Mother: CV dz, HTN, TIA.  Father CV dz, MI, HTN, DM.  Sister: HTN, CVA, Seizures, Cancer.  Grandparents: MI, CVA.  ROS: Currently denies lightheadedness, dizziness, Fever, chills, CP, SOB. No personal history of DVT, PE, MI, or CVA. No loose teeth or dentures.  She does have implant. All other systems have been reviewed and were otherwise currently negative with the exception of those mentioned in the HPI and as above.  Objective: Vitals: Ht: 5'5" Wt: 177 Temp: 98.1 BP: 110/75 Pulse: 80 O2 97% on room air. Physical Exam: General: Alert, NAD.  Trendelenberg Gait  HEENT: EOMI, Good Neck Extension  Pulm: No increased work of breathing.  Clear B/L A/P w/o crackle or wheeze.  CV:  RRR, No m/g/r appreciated  GI: soft, NT, ND Neuro: Neuro without gross focal deficit.  Sensation intact distally Skin: No lesions in the area of chief complaint MSK/Surgical Site: Right Hip mildly tender over greater trochanter.  Pain with passive ROM.  Positive Stinchfield.  5/5 strength.  NVI.  Sensation intact distally.   Imaging Review Plain radiographs demonstrate severe degenerative joint disease of the right hip.   Assessment: OA RIGHT HIP Principal Problem:   Osteoarthritis of right hip Active Problems:   Hx of compression fracture of spine    Smoker  Plan: Plan for Procedure(s): RIGHT TOTAL HIP ARTHROPLASTY  The patient history, physical exam, clinical judgement of the provider and imaging are consistent with end stage degenerative joint disease and total joint arthroplasty is deemed medically necessary. The treatment options including medical management, injection therapy, and arthroplasty were discussed at length. The risks and benefits of Procedure(s): RIGHT TOTAL HIP ARTHROPLASTY were presented and reviewed.  The risks of nonoperative treatment, versus surgical intervention including but not limited to continued pain, aseptic loosening, stiffness, dislocation/subluxation, infection, bleeding, nerve injury, blood clots, cardiopulmonary complications, morbidity, mortality, among others were discussed. The patient verbalizes understanding and wishes to proceed with the plan.  Patient is being admitted for inpatient treatment for surgery, pain control, PT, OT, prophylactic antibiotics, VTE prophylaxis, progressive ambulation, ADL's and discharge planning.   Dental prophylaxis discussed and recommended for 2 years postoperatively.  The patient does meet the criteria for TXA which will be used perioperatively via IV.   ASA 325 mg  will be used postoperatively for DVT prophylaxis in addition to SCDs, and early ambulation. The patient is planning to be discharged home with home health services ([facility]) in care of her husband.  Prudencio Burly III, PA-C 08/26/2016 1:14 PM

## 2016-08-27 ENCOUNTER — Encounter: Payer: Self-pay | Admitting: Family Medicine

## 2016-08-27 ENCOUNTER — Ambulatory Visit (INDEPENDENT_AMBULATORY_CARE_PROVIDER_SITE_OTHER): Payer: BLUE CROSS/BLUE SHIELD | Admitting: Family Medicine

## 2016-08-27 VITALS — BP 118/78 | HR 72 | Resp 16 | Ht 65.0 in | Wt 173.0 lb

## 2016-08-27 DIAGNOSIS — Z1211 Encounter for screening for malignant neoplasm of colon: Secondary | ICD-10-CM

## 2016-08-27 DIAGNOSIS — M1611 Unilateral primary osteoarthritis, right hip: Secondary | ICD-10-CM

## 2016-08-27 DIAGNOSIS — M810 Age-related osteoporosis without current pathological fracture: Secondary | ICD-10-CM

## 2016-08-27 DIAGNOSIS — E663 Overweight: Secondary | ICD-10-CM

## 2016-08-27 DIAGNOSIS — J302 Other seasonal allergic rhinitis: Secondary | ICD-10-CM | POA: Diagnosis not present

## 2016-08-27 DIAGNOSIS — F17200 Nicotine dependence, unspecified, uncomplicated: Secondary | ICD-10-CM | POA: Diagnosis not present

## 2016-08-27 DIAGNOSIS — J309 Allergic rhinitis, unspecified: Secondary | ICD-10-CM | POA: Insufficient documentation

## 2016-08-27 MED ORDER — FLUTICASONE PROPIONATE 50 MCG/ACT NA SUSP: 2.0000 | Freq: Every day | NASAL | 3 refills | Status: DC

## 2016-08-27 NOTE — Progress Notes (Signed)
Date:  08/27/2016   Name:  Erin Donovan   DOB:  [birth year]   MRN:  [id number]  PCP:  Adline Potter, MD    Chief Complaint: Osteoarthritis   History of Present Illness:  This is a 61 y.o. female seen in one month f/u form initial visit. For R THR 3/20. Remains on Forteo/Ca/vit D for OP. Weight down 3#, not exercising due to hip pain. Still smoking, understands need to quit. Requests [facility] for AR sxs. Needs colonoscopy, last 10 yrs ago, has mammo scheduled. Blood work last month unremarkable.  Review of Systems:  Review of Systems  Constitutional: Negative for chills and fever.  Respiratory: Negative for cough and shortness of breath.   Cardiovascular: Negative for chest pain and leg swelling.  Gastrointestinal: Negative for abdominal pain.  Genitourinary: Negative for dysuria.  Neurological: Negative for syncope and light-headedness.    Patient Active Problem List   Diagnosis Date Noted  . Allergic rhinitis 08/27/2016  . Osteoarthritis of right hip 07/31/2016  . Age-related osteoporosis without current pathological fracture 07/31/2016  . Hx of compression fracture of spine 07/31/2016  . Overweight (BMI 25.0-29.9) 07/31/2016  . Smoker 07/31/2016    Prior to Admission medications   Medication Sig Start Date End Date Taking? Authorizing Provider  Biotin 10000 MCG TABS Take 1 tablet by mouth daily. 07/31/16  Yes Adline Potter, MD  calcium carbonate (OS-CAL - DOSED IN MG OF ELEMENTAL CALCIUM) 1250 (500 Ca) MG tablet Take 1 tablet (500 mg of elemental calcium total) by mouth daily with breakfast. 07/31/16  Yes Adline Potter, MD  Cholecalciferol (VITAMIN D3) 5000 units CAPS Take 1 capsule (5,000 Units total) by mouth daily. 07/31/16  Yes Adline Potter, MD  HYDROcodone-acetaminophen (NORCO) 7.5-325 MG tablet Take 1 tablet by mouth every 6 (six) hours as needed for moderate pain.   Yes Historical Provider, MD  magnesium oxide (MAG-OX) 400 MG tablet Take 1 tablet (400 mg total) by mouth  daily. 07/31/16  Yes Adline Potter, MD  meloxicam (MOBIC) 15 MG tablet Take 15 mg by mouth daily.   Yes Historical Provider, MD  Teriparatide, Recombinant, (FORTEO [place]) Inject 20 mg into the skin.   Yes Historical Provider, MD  fluticasone (FLONASE) 50 MCG/ACT nasal spray Place 2 sprays into both nostrils daily. 08/27/16   Adline Potter, MD    No Known Allergies  History reviewed. No pertinent surgical history.  Social History  Substance Use Topics  . Smoking status: Current Every Day Smoker    Packs/day: 0.50    Types: Cigarettes  . Smokeless tobacco: Never Used  . Alcohol use No    History reviewed. No pertinent family history.  Medication list has been reviewed and updated.  Physical Examination: BP 118/78   Pulse 72   Resp 16   Ht 5\' 5"  (1.651 m)   Wt 173 lb (78.5 kg)   SpO2 99%   BMI 28.79 kg/m   Physical Exam  Constitutional: She appears well-developed and well-nourished.  Cardiovascular: Normal rate, regular rhythm and normal heart sounds.   Pulmonary/Chest: Effort normal and breath sounds normal.  Musculoskeletal: She exhibits no edema.  Neurological: She is alert.  Skin: Skin is warm and dry.  Psychiatric: She has a normal mood and affect. Her behavior is normal.  Nursing note and vitals reviewed.   Assessment and Plan:  1. Primary osteoarthritis of right hip For R THR next month, cont Mobic/Norco prn until then  2. Age-related osteoporosis without current pathological fracture Cont Forteo/Ca/vit D  for now (Forteo use recommended for two years only), vit D level 47 last month  3. Overweight (BMI 25.0-29.9) Weight down 3 #, discussed exercise/weight loss after surgery  4. Smoker Strongly advised cessation with surgery  5. Chronic seasonal allergic rhinitis, unspecified trigger Flonase rx sent  6. Colon cancer screening - Ambulatory referral to Gastroenterology  7. Med review Consider d/c Ca, Mg, Mobic, Forteo, biotin next visit  Return in about  6 months (around 02/24/2017).  Satira Anis. [facility]  08/27/2016

## 2016-09-06 ENCOUNTER — Encounter (HOSPITAL_COMMUNITY): Payer: Self-pay

## 2016-09-06 ENCOUNTER — Encounter (HOSPITAL_COMMUNITY)
Admission: RE | Admit: 2016-09-06 | Discharge: 2016-09-06 | Disposition: A | Discharge status: Home / Self Care | Payer: BLUE CROSS/BLUE SHIELD | Source: Ambulatory Visit | Attending: Orthopedic Surgery | Admitting: Orthopedic Surgery

## 2016-09-06 DIAGNOSIS — Z01812 Encounter for preprocedural laboratory examination: Secondary | ICD-10-CM | POA: Insufficient documentation

## 2016-09-06 DIAGNOSIS — M1611 Unilateral primary osteoarthritis, right hip: Secondary | ICD-10-CM | POA: Insufficient documentation

## 2016-09-06 LAB — BASIC METABOLIC PANEL
Anion gap: 6 (ref 5–15)
BUN: 13 mg/dL (ref 6–20)
CHLORIDE: 104 mmol/L (ref 101–111)
CO2: 27 mmol/L (ref 22–32)
Calcium: 9.3 mg/dL (ref 8.9–10.3)
Creatinine, Ser: 0.79 mg/dL (ref 0.44–1.00)
GFR calc Af Amer: >60 mL/min (ref >60)
GFR calc non Af Amer: >60 mL/min (ref >60)
GLUCOSE: 98 mg/dL (ref 65–99)
Potassium: 4.1 mmol/L (ref 3.5–5.1)
SODIUM: 137 mmol/L (ref 135–145)

## 2016-09-06 LAB — CBC
HCT: 40.9 % (ref 36.0–46.0)
Hemoglobin: 13.5 g/dL (ref 12.0–15.0)
MCH: 29.8 pg (ref 26.0–34.0)
MCHC: 33 g/dL (ref 30.0–36.0)
MCV: 90.3 fL (ref 78.0–100.0)
PLATELETS: 278 10*3/uL (ref 150–400)
RBC: 4.53 MIL/uL (ref 3.87–5.11)
RDW: 13.8 % (ref 11.5–15.5)
WBC: 7.1 10*3/uL (ref 4.0–10.5)

## 2016-09-06 LAB — SURGICAL PCR SCREEN
MRSA, PCR: NEGATIVE
Staphylococcus aureus: NEGATIVE

## 2016-09-06 NOTE — Pre-Procedure Instructions (Addendum)
Erin Donovan  09/06/2016      [id number] - [place], [place] - [address] [place] [address] Phone: [phone] Fax: [phone]    Your procedure is scheduled on 09/17/16.  Report to [facility] Admitting at [place] A.[facility].  Call this number if you have problems the morning of surgery:  [phone]   Remember:  Do not eat food or drink liquids after midnight.  Take these medicines the morning of surgery with A SIP OF WATER     Valium and hydrocodone if needed  STOP all herbel meds, nsaids (aleve,naproxen,advil,ibuprofen)   Starting 09/10/16 including ALL vitamins/supplements,over the counter meds,aspirin, meloxicam   Do not wear jewelry, make-up or nail polish.  Do not wear lotions, powders, or perfumes, or deoderant.  Do not shave 48 hours prior to surgery.  Men may shave face and neck.  Do not bring valuables to the hospital.  [facility] is not responsible for any belongings or valuables.  Contacts, dentures or bridgework may not be worn into surgery.  Leave your suitcase in the car.  After surgery it may be brought to your room.  For patients admitted to the hospital, discharge time will be determined by your treatment team.  Patients discharged the day of surgery will not be allowed to drive home.   Special instructions:  Special Instructions: [place] - Preparing for Surgery  Before surgery, you can play an important role.  Because skin is not sterile, your skin needs to be as free of germs as possible.  You can reduce the number of germs on you skin by washing with CHG (chlorahexidine gluconate) soap before surgery.  CHG is an antiseptic cleaner which kills germs and bonds with the skin to continue killing germs even after washing.  Please DO NOT use if you have an allergy to CHG or antibacterial soaps.  If your skin becomes reddened/irritated stop using the CHG and inform your nurse when you arrive at Short Stay.  Do not  shave (including legs and underarms) for at least 48 hours prior to the first CHG shower.  You may shave your face.  Please follow these instructions carefully:   1.  Shower with CHG Soap the night before surgery and the morning of Surgery.  2.  If you choose to wash your hair, wash your hair first as usual with your normal shampoo.  3.  After you shampoo, rinse your hair and body thoroughly to remove the Shampoo.  4.  Use CHG as you would any other liquid soap.  You can apply chg directly  to the skin and wash gently with scrungie or a clean washcloth.  5.  Apply the CHG Soap to your body ONLY FROM THE NECK DOWN.  Do not use on open wounds or open sores.  Avoid contact with your eyes ears, mouth and genitals (private parts).  Wash genitals (private parts)       with your normal soap.  6.  Wash thoroughly, paying special attention to the area where your surgery will be performed.  7.  Thoroughly rinse your body with warm water from the neck down.  8.  DO NOT shower/wash with your normal soap after using and rinsing off the CHG Soap.  9.  Pat yourself dry with a clean towel.            10.  Wear clean pajamas.  11.  Place clean sheets on your bed the night of your first shower and do not sleep with pets.  Day of Surgery  Do not apply any lotions/deodorants the morning of surgery.  Please wear clean clothes to the hospital/surgery center.  Please read over the  fact sheets that you were given.

## 2016-09-10 ENCOUNTER — Ambulatory Visit (INDEPENDENT_AMBULATORY_CARE_PROVIDER_SITE_OTHER): Payer: BLUE CROSS/BLUE SHIELD | Admitting: Family Medicine

## 2016-09-10 ENCOUNTER — Encounter: Payer: Self-pay | Admitting: Family Medicine

## 2016-09-10 VITALS — BP 118/78 | HR 86 | Temp 98.1°F | Resp 16 | Ht 65.0 in | Wt 176.0 lb

## 2016-09-10 DIAGNOSIS — J40 Bronchitis, not specified as acute or chronic: Secondary | ICD-10-CM | POA: Diagnosis not present

## 2016-09-10 DIAGNOSIS — M1611 Unilateral primary osteoarthritis, right hip: Secondary | ICD-10-CM

## 2016-09-10 MED ORDER — AZITHROMYCIN 250 MG PO TABS: ORAL_TABLET | ORAL | 0 refills | Status: DC

## 2016-09-10 NOTE — Progress Notes (Signed)
Date:  09/10/2016   Name:  Erin Donovan   DOB:  12/31/1955   MRN:  [id number]  PCP:  Adline Potter, MD    Chief Complaint: Cough   History of Present Illness:  This is a 61 y.o. female seen acutely for a 2d hx of cough productive of green/yellow phlegm, rhinorrhea, and sinus congestion. She  Has tried numerous OTC meds. She is scheduled for a R THR on 09/17/16.  Review of Systems:  Review of Systems  Constitutional: Negative for chills and fever.  HENT: Negative for ear pain and sore throat.   Eyes: Negative for pain.  Respiratory: Negative for shortness of breath.   Cardiovascular: Negative for chest pain and leg swelling.  Gastrointestinal: Negative for abdominal pain.  Neurological: Negative for syncope and light-headedness.  Hematological: Negative for adenopathy.    Patient Active Problem List   Diagnosis Date Noted  . Allergic rhinitis 08/27/2016  . Osteoarthritis of right hip 07/31/2016  . Age-related osteoporosis without current pathological fracture 07/31/2016  . Hx of compression fracture of spine 07/31/2016  . Overweight (BMI 25.0-29.9) 07/31/2016  . Smoker 07/31/2016    Prior to Admission medications   Medication Sig Start Date End Date Taking? Authorizing Provider  Biotin 10000 MCG TABS Take 1 tablet by mouth daily. 07/31/16  Yes Adline Potter, MD  Calcium-Vitamin D-Vitamin K (VIACTIV) 323-557-32 MG-UNT-MCG CHEW Chew 2 tablets by mouth daily.   Yes Historical Provider, MD  cetirizine (ZYRTEC) 10 MG tablet Take 10 mg by mouth as needed for allergies.   Yes Historical Provider, MD  Cholecalciferol (VITAMIN D3) 5000 units CAPS Take 1 capsule (5,000 Units total) by mouth daily. 07/31/16  Yes Adline Potter, MD  diazepam (VALIUM) 5 MG tablet Take 5 mg by mouth daily as needed for anxiety.   Yes Historical Provider, MD  fluticasone (FLONASE) 50 MCG/ACT nasal spray Place 2 sprays into both nostrils daily. 08/27/16  Yes Adline Potter, MD  HYDROcodone-acetaminophen  (NORCO/VICODIN) 5-325 MG tablet Take 1 tablet by mouth daily as needed for moderate pain.   Yes Historical Provider, MD  ibuprofen (ADVIL,MOTRIN) 200 MG tablet Take 400 mg by mouth every 6 (six) hours as needed for mild pain or moderate pain.   Yes Historical Provider, MD  magnesium oxide (MAG-OX) 400 MG tablet Take 1 tablet (400 mg total) by mouth daily. 07/31/16  Yes Adline Potter, MD  meloxicam (MOBIC) 15 MG tablet Take 15 mg by mouth daily.   Yes Historical Provider, MD  OVER THE COUNTER MEDICATION Take 2 tablets by mouth daily. Juice Plus Fruit Blend   Yes Historical Provider, MD  OVER THE COUNTER MEDICATION Take 2 tablets by mouth daily. Juice Plus Vegetable blend   Yes Historical Provider, MD  OVER THE COUNTER MEDICATION Take 2 tablets by mouth daily. Juice Plus Vineyard blend   Yes Historical Provider, MD  Teriparatide, Recombinant, (FORTEO Belfonte) Inject 20 mg into the skin daily. On hold for the procedure   Yes Historical Provider, MD  azithromycin (ZITHROMAX) 250 MG tablet Take two tablets today then one tablet daily for 4 days 09/10/16   Adline Potter, MD    No Known Allergies  Past Surgical History:  Procedure Laterality Date  . NO PAST SURGERIES      Social History  Substance Use Topics  . Smoking status: Current Every Day Smoker    Packs/day: 1.00    Years: 40.00    Types: Cigarettes  . Smokeless tobacco: Never Used  . Alcohol use No  History reviewed. No pertinent family history.  Medication list has been reviewed and updated.  Physical Examination: BP 118/78   Pulse 86   Temp 98.1 F (36.7 C)   Resp 16   Ht 5\' 5"  (1.651 m)   Wt 176 lb (79.8 kg)   SpO2 97%   BMI 29.29 kg/m   Physical Exam  Constitutional: She appears well-developed and well-nourished.  HENT:  Right Ear: External ear normal.  Left Ear: External ear normal.  Mouth/Throat: Oropharynx is clear and moist.  TMs clear  Cardiovascular: Normal rate, regular rhythm and normal heart sounds.    Pulmonary/Chest: Effort normal and breath sounds normal.  Lymphadenopathy:    She has no cervical adenopathy.  Neurological: She is alert.  Skin: Skin is warm and dry.  Psychiatric: She has a normal mood and affect. Her behavior is normal.  Nursing note and vitals reviewed.   Assessment and Plan:  1. Bronchitis Likely viral but with smoking hx and impending surgery will cover with Zpak.  2. Primary osteoarthritis of right hip For [facility] 09/17/16  Return if symptoms worsen or fail to improve.  Satira Anis. [facility]  09/10/2016

## 2016-09-16 MED ORDER — TRANEXAMIC ACID 1000 MG/10ML IV SOLN
1000.0000 mg | INTRAVENOUS | Status: AC
  Administered 2016-09-17: 1000 mg via INTRAVENOUS
  Filled 2016-09-16: qty 10

## 2016-09-17 ENCOUNTER — Inpatient Hospital Stay (HOSPITAL_COMMUNITY): Payer: BLUE CROSS/BLUE SHIELD | Admitting: Certified Registered Nurse Anesthetist

## 2016-09-17 ENCOUNTER — Inpatient Hospital Stay (HOSPITAL_COMMUNITY): Payer: BLUE CROSS/BLUE SHIELD

## 2016-09-17 ENCOUNTER — Inpatient Hospital Stay (HOSPITAL_COMMUNITY)
Admission: RE | Admit: 2016-09-17 | Discharge: 2016-09-18 | DRG: 470 | Disposition: A | Discharge status: Home / Self Care | Payer: BLUE CROSS/BLUE SHIELD | Source: Ambulatory Visit | Attending: Orthopedic Surgery | Admitting: Orthopedic Surgery

## 2016-09-17 ENCOUNTER — Encounter (HOSPITAL_COMMUNITY)
Admission: RE | Disposition: A | Discharge status: Home / Self Care | Payer: Self-pay | Source: Ambulatory Visit | Attending: Orthopedic Surgery

## 2016-09-17 ENCOUNTER — Encounter (HOSPITAL_COMMUNITY): Payer: Self-pay | Admitting: Certified Registered Nurse Anesthetist

## 2016-09-17 DIAGNOSIS — Z79899 Other long term (current) drug therapy: Secondary | ICD-10-CM

## 2016-09-17 DIAGNOSIS — Z79891 Long term (current) use of opiate analgesic: Secondary | ICD-10-CM | POA: Diagnosis not present

## 2016-09-17 DIAGNOSIS — Z96641 Presence of right artificial hip joint: Secondary | ICD-10-CM | POA: Diagnosis not present

## 2016-09-17 DIAGNOSIS — Z471 Aftercare following joint replacement surgery: Secondary | ICD-10-CM | POA: Diagnosis not present

## 2016-09-17 DIAGNOSIS — Q782 Osteopetrosis: Secondary | ICD-10-CM | POA: Diagnosis not present

## 2016-09-17 DIAGNOSIS — M1611 Unilateral primary osteoarthritis, right hip: Principal | ICD-10-CM | POA: Diagnosis present

## 2016-09-17 DIAGNOSIS — M545 Low back pain: Secondary | ICD-10-CM | POA: Diagnosis not present

## 2016-09-17 DIAGNOSIS — Z8781 Personal history of (healed) traumatic fracture: Secondary | ICD-10-CM

## 2016-09-17 DIAGNOSIS — F17200 Nicotine dependence, unspecified, uncomplicated: Secondary | ICD-10-CM | POA: Diagnosis present

## 2016-09-17 DIAGNOSIS — M1990 Unspecified osteoarthritis, unspecified site: Secondary | ICD-10-CM | POA: Diagnosis present

## 2016-09-17 DIAGNOSIS — Z85828 Personal history of other malignant neoplasm of skin: Secondary | ICD-10-CM | POA: Diagnosis not present

## 2016-09-17 DIAGNOSIS — F17210 Nicotine dependence, cigarettes, uncomplicated: Secondary | ICD-10-CM | POA: Diagnosis present

## 2016-09-17 DIAGNOSIS — Z419 Encounter for procedure for purposes other than remedying health state, unspecified: Secondary | ICD-10-CM

## 2016-09-17 DIAGNOSIS — D62 Acute posthemorrhagic anemia: Secondary | ICD-10-CM | POA: Diagnosis not present

## 2016-09-17 DIAGNOSIS — G8929 Other chronic pain: Secondary | ICD-10-CM | POA: Diagnosis not present

## 2016-09-17 HISTORY — DX: Basal cell carcinoma of skin of unspecified parts of face: C44.310

## 2016-09-17 HISTORY — DX: Other chronic pain: G89.29

## 2016-09-17 HISTORY — DX: Low back pain, unspecified: M54.50

## 2016-09-17 HISTORY — PX: TOTAL HIP ARTHROPLASTY: SHX124

## 2016-09-17 HISTORY — DX: Low back pain: M54.5

## 2016-09-17 SURGERY — ARTHROPLASTY, HIP, TOTAL, ANTERIOR APPROACH
Anesthesia: General | Site: Hip | Laterality: Right

## 2016-09-17 MED ORDER — MAGNESIUM OXIDE 400 (241.3 MG) MG PO TABS
400.0000 mg | ORAL_TABLET | Freq: Every day | ORAL | Status: DC
  Administered 2016-09-18: 400 mg via ORAL
  Filled 2016-09-17: qty 1

## 2016-09-17 MED ORDER — OXYCODONE HCL 5 MG PO TABS
5.0000 mg | ORAL_TABLET | ORAL | Status: DC | PRN
  Administered 2016-09-17: 5 mg via ORAL
  Administered 2016-09-18: 10 mg via ORAL
  Filled 2016-09-17: qty 2

## 2016-09-17 MED ORDER — METHOCARBAMOL 1000 MG/10ML IJ SOLN
500.0000 mg | Freq: Four times a day (QID) | INTRAVENOUS | Status: DC | PRN
  Filled 2016-09-17: qty 5

## 2016-09-17 MED ORDER — GABAPENTIN 300 MG PO CAPS
300.0000 mg | ORAL_CAPSULE | Freq: Once | ORAL | Status: AC
  Administered 2016-09-17: 300 mg via ORAL

## 2016-09-17 MED ORDER — METOCLOPRAMIDE HCL 5 MG PO TABS: 5.0000 mg | ORAL_TABLET | Freq: Three times a day (TID) | ORAL | Status: DC | PRN

## 2016-09-17 MED ORDER — ONDANSETRON HCL 4 MG PO TABS: 4.0000 mg | ORAL_TABLET | Freq: Three times a day (TID) | ORAL | 0 refills | Status: DC | PRN

## 2016-09-17 MED ORDER — ACETAMINOPHEN 325 MG PO TABS: 650.0000 mg | ORAL_TABLET | Freq: Four times a day (QID) | ORAL | Status: DC | PRN

## 2016-09-17 MED ORDER — FENTANYL CITRATE (PF) 100 MCG/2ML IJ SOLN
INTRAMUSCULAR | Status: AC
  Filled 2016-09-17: qty 2

## 2016-09-17 MED ORDER — ONDANSETRON HCL 4 MG/2ML IJ SOLN
INTRAMUSCULAR | Status: AC
  Filled 2016-09-17: qty 2

## 2016-09-17 MED ORDER — PHENOL 1.4 % MT LIQD: 1.0000 | OROMUCOSAL | Status: DC | PRN

## 2016-09-17 MED ORDER — DEXAMETHASONE SODIUM PHOSPHATE 10 MG/ML IJ SOLN
INTRAMUSCULAR | Status: AC
  Filled 2016-09-17: qty 1

## 2016-09-17 MED ORDER — PROPOFOL 10 MG/ML IV BOLUS
INTRAVENOUS | Status: DC | PRN
  Administered 2016-09-17: 150 mg via INTRAVENOUS

## 2016-09-17 MED ORDER — EPHEDRINE SULFATE 50 MG/ML IJ SOLN
INTRAMUSCULAR | Status: DC | PRN
  Administered 2016-09-17 (×3): 10 mg via INTRAVENOUS
  Administered 2016-09-17: 5 mg via INTRAVENOUS
  Administered 2016-09-17: 10 mg via INTRAVENOUS

## 2016-09-17 MED ORDER — CEFAZOLIN SODIUM-DEXTROSE 2-4 GM/100ML-% IV SOLN
2.0000 g | INTRAVENOUS | Status: AC
  Administered 2016-09-17: 2 g via INTRAVENOUS
  Filled 2016-09-17: qty 100

## 2016-09-17 MED ORDER — CELECOXIB 200 MG PO CAPS
200.0000 mg | ORAL_CAPSULE | Freq: Two times a day (BID) | ORAL | Status: DC
  Administered 2016-09-17 – 2016-09-18 (×2): 200 mg via ORAL
  Filled 2016-09-17 (×2): qty 1

## 2016-09-17 MED ORDER — OXYCODONE HCL 5 MG PO TABS
ORAL_TABLET | ORAL | Status: AC
  Filled 2016-09-17: qty 1

## 2016-09-17 MED ORDER — CHLORHEXIDINE GLUCONATE 4 % EX LIQD: 60.0000 mL | Freq: Once | CUTANEOUS | Status: DC

## 2016-09-17 MED ORDER — LORATADINE 10 MG PO TABS
10.0000 mg | ORAL_TABLET | Freq: Every day | ORAL | Status: DC
  Administered 2016-09-18: 10 mg via ORAL
  Filled 2016-09-17: qty 1

## 2016-09-17 MED ORDER — FLEET ENEMA 7-19 GM/118ML RE ENEM: 1.0000 | ENEMA | Freq: Once | RECTAL | Status: DC | PRN

## 2016-09-17 MED ORDER — ASPIRIN EC 325 MG PO TBEC: 325.0000 mg | DELAYED_RELEASE_TABLET | Freq: Every day | ORAL | 0 refills | Status: DC

## 2016-09-17 MED ORDER — DIAZEPAM 5 MG PO TABS
5.0000 mg | ORAL_TABLET | Freq: Every day | ORAL | Status: DC | PRN
Start: 2016-09-17 — End: 2016-09-18

## 2016-09-17 MED ORDER — LIDOCAINE 2% (20 MG/ML) 5 ML SYRINGE
INTRAMUSCULAR | Status: AC
  Filled 2016-09-17: qty 15

## 2016-09-17 MED ORDER — ASPIRIN EC 325 MG PO TBEC
325.0000 mg | DELAYED_RELEASE_TABLET | Freq: Every day | ORAL | Status: DC
  Administered 2016-09-18: 325 mg via ORAL
  Filled 2016-09-17: qty 1

## 2016-09-17 MED ORDER — KETOROLAC TROMETHAMINE 30 MG/ML IJ SOLN
INTRAMUSCULAR | Status: AC
  Filled 2016-09-17: qty 1

## 2016-09-17 MED ORDER — ACETAMINOPHEN 325 MG PO TABS
ORAL_TABLET | ORAL | Status: AC
  Filled 2016-09-17: qty 3

## 2016-09-17 MED ORDER — METHOCARBAMOL 500 MG PO TABS
ORAL_TABLET | ORAL | Status: AC
  Filled 2016-09-17: qty 1

## 2016-09-17 MED ORDER — HYDROMORPHONE HCL 1 MG/ML IJ SOLN
0.2500 mg | INTRAMUSCULAR | Status: DC | PRN
  Administered 2016-09-17 (×2): 0.5 mg via INTRAVENOUS

## 2016-09-17 MED ORDER — SODIUM CHLORIDE FLUSH 0.9 % IV SOLN
INTRAVENOUS | Status: DC | PRN
  Administered 2016-09-17: 30 mL

## 2016-09-17 MED ORDER — METOCLOPRAMIDE HCL 5 MG/ML IJ SOLN: 5.0000 mg | Freq: Three times a day (TID) | INTRAMUSCULAR | Status: DC | PRN

## 2016-09-17 MED ORDER — BUPIVACAINE HCL (PF) 0.25 % IJ SOLN
INTRAMUSCULAR | Status: AC
  Filled 2016-09-17: qty 30

## 2016-09-17 MED ORDER — FENTANYL CITRATE (PF) 100 MCG/2ML IJ SOLN
INTRAMUSCULAR | Status: DC | PRN
  Administered 2016-09-17: 50 ug via INTRAVENOUS
  Administered 2016-09-17: 100 ug via INTRAVENOUS
  Administered 2016-09-17 (×3): 50 ug via INTRAVENOUS

## 2016-09-17 MED ORDER — METHOCARBAMOL 500 MG PO TABS: 500.0000 mg | ORAL_TABLET | Freq: Four times a day (QID) | ORAL | 0 refills | Status: DC | PRN

## 2016-09-17 MED ORDER — ROCURONIUM BROMIDE 50 MG/5ML IV SOSY
PREFILLED_SYRINGE | INTRAVENOUS | Status: AC
  Filled 2016-09-17: qty 5

## 2016-09-17 MED ORDER — SUGAMMADEX SODIUM 200 MG/2ML IV SOLN
INTRAVENOUS | Status: DC | PRN
  Administered 2016-09-17: 200 mg via INTRAVENOUS

## 2016-09-17 MED ORDER — PROPOFOL 10 MG/ML IV BOLUS
INTRAVENOUS | Status: AC
  Filled 2016-09-17: qty 20

## 2016-09-17 MED ORDER — SORBITOL 70 % SOLN: 30.0000 mL | Freq: Every day | Status: DC | PRN

## 2016-09-17 MED ORDER — DEXAMETHASONE SODIUM PHOSPHATE 10 MG/ML IJ SOLN
10.0000 mg | Freq: Once | INTRAMUSCULAR | Status: AC
  Administered 2016-09-18: 10 mg via INTRAVENOUS
  Filled 2016-09-17: qty 1

## 2016-09-17 MED ORDER — ROCURONIUM BROMIDE 50 MG/5ML IV SOSY
PREFILLED_SYRINGE | INTRAVENOUS | Status: DC | PRN
  Administered 2016-09-17: 10 mg via INTRAVENOUS
  Administered 2016-09-17: 30 mg via INTRAVENOUS

## 2016-09-17 MED ORDER — MIDAZOLAM HCL 2 MG/2ML IJ SOLN
INTRAMUSCULAR | Status: DC | PRN
  Administered 2016-09-17: 2 mg via INTRAVENOUS

## 2016-09-17 MED ORDER — MENTHOL 3 MG MT LOZG: 1.0000 | LOZENGE | OROMUCOSAL | Status: DC | PRN

## 2016-09-17 MED ORDER — LACTATED RINGERS IV SOLN
INTRAVENOUS | Status: DC
  Administered 2016-09-17 (×4): via INTRAVENOUS

## 2016-09-17 MED ORDER — METHOCARBAMOL 500 MG PO TABS
500.0000 mg | ORAL_TABLET | Freq: Four times a day (QID) | ORAL | Status: DC | PRN
  Administered 2016-09-17 – 2016-09-18 (×2): 500 mg via ORAL
  Filled 2016-09-17: qty 1

## 2016-09-17 MED ORDER — ONDANSETRON HCL 4 MG/2ML IJ SOLN
INTRAMUSCULAR | Status: DC | PRN
  Administered 2016-09-17: 4 mg via INTRAVENOUS

## 2016-09-17 MED ORDER — ACETAMINOPHEN 325 MG PO TABS
650.0000 mg | ORAL_TABLET | Freq: Four times a day (QID) | ORAL | Status: DC
  Administered 2016-09-17 – 2016-09-18 (×3): 650 mg via ORAL
  Filled 2016-09-17 (×3): qty 2

## 2016-09-17 MED ORDER — GABAPENTIN 300 MG PO CAPS
ORAL_CAPSULE | ORAL | Status: AC
  Administered 2016-09-17: 300 mg via ORAL
  Filled 2016-09-17: qty 1

## 2016-09-17 MED ORDER — PHENYLEPHRINE HCL 10 MG/ML IJ SOLN
INTRAMUSCULAR | Status: DC | PRN
  Administered 2016-09-17: 80 ug via INTRAVENOUS

## 2016-09-17 MED ORDER — LIDOCAINE HCL (CARDIAC) 20 MG/ML IV SOLN
INTRAVENOUS | Status: DC | PRN
  Administered 2016-09-17: 80 mg via INTRAVENOUS

## 2016-09-17 MED ORDER — KETOROLAC TROMETHAMINE 15 MG/ML IJ SOLN
15.0000 mg | Freq: Four times a day (QID) | INTRAMUSCULAR | Status: DC
  Administered 2016-09-17 – 2016-09-18 (×3): 15 mg via INTRAVENOUS
  Filled 2016-09-17 (×3): qty 1

## 2016-09-17 MED ORDER — PHENYLEPHRINE 40 MCG/ML (10ML) SYRINGE FOR IV PUSH (FOR BLOOD PRESSURE SUPPORT)
PREFILLED_SYRINGE | INTRAVENOUS | Status: AC
  Filled 2016-09-17: qty 40

## 2016-09-17 MED ORDER — ONDANSETRON HCL 4 MG PO TABS
4.0000 mg | ORAL_TABLET | Freq: Four times a day (QID) | ORAL | Status: DC | PRN
  Administered 2016-09-18: 4 mg via ORAL
  Filled 2016-09-17: qty 1

## 2016-09-17 MED ORDER — MORPHINE SULFATE (PF) 2 MG/ML IV SOLN: 2.0000 mg | INTRAVENOUS | Status: DC | PRN

## 2016-09-17 MED ORDER — ONDANSETRON HCL 4 MG/2ML IJ SOLN: 4.0000 mg | Freq: Four times a day (QID) | INTRAMUSCULAR | Status: DC | PRN

## 2016-09-17 MED ORDER — DIPHENHYDRAMINE HCL 12.5 MG/5ML PO ELIX: 12.5000 mg | ORAL_SOLUTION | ORAL | Status: DC | PRN

## 2016-09-17 MED ORDER — 0.9 % SODIUM CHLORIDE (POUR BTL) OPTIME
TOPICAL | Status: DC | PRN
  Administered 2016-09-17: 1000 mL

## 2016-09-17 MED ORDER — SUCCINYLCHOLINE CHLORIDE 200 MG/10ML IV SOSY
PREFILLED_SYRINGE | INTRAVENOUS | Status: AC
  Filled 2016-09-17: qty 10

## 2016-09-17 MED ORDER — POLYETHYLENE GLYCOL 3350 17 G PO PACK: 17.0000 g | PACK | Freq: Every day | ORAL | Status: DC | PRN

## 2016-09-17 MED ORDER — TRANEXAMIC ACID 1000 MG/10ML IV SOLN
2000.0000 mg | Freq: Once | INTRAVENOUS | Status: AC
  Administered 2016-09-17: 2000 mg via TOPICAL
  Filled 2016-09-17: qty 20

## 2016-09-17 MED ORDER — SENNA 8.6 MG PO TABS
1.0000 | ORAL_TABLET | Freq: Two times a day (BID) | ORAL | Status: DC
  Administered 2016-09-17 – 2016-09-18 (×2): 8.6 mg via ORAL
  Filled 2016-09-17 (×2): qty 1

## 2016-09-17 MED ORDER — HYDROMORPHONE HCL 1 MG/ML IJ SOLN
INTRAMUSCULAR | Status: AC
  Filled 2016-09-17: qty 1

## 2016-09-17 MED ORDER — ACETAMINOPHEN 650 MG RE SUPP: 650.0000 mg | Freq: Four times a day (QID) | RECTAL | Status: DC | PRN

## 2016-09-17 MED ORDER — MIDAZOLAM HCL 2 MG/2ML IJ SOLN
INTRAMUSCULAR | Status: AC
  Filled 2016-09-17: qty 2

## 2016-09-17 MED ORDER — EPHEDRINE 5 MG/ML INJ
INTRAVENOUS | Status: AC
  Filled 2016-09-17: qty 20

## 2016-09-17 MED ORDER — DOCUSATE SODIUM 100 MG PO CAPS: 100.0000 mg | ORAL_CAPSULE | Freq: Two times a day (BID) | ORAL | 0 refills | Status: DC

## 2016-09-17 MED ORDER — BUPIVACAINE-EPINEPHRINE 0.25% -1:200000 IJ SOLN
INTRAMUSCULAR | Status: DC | PRN
  Administered 2016-09-17: 30 mL

## 2016-09-17 MED ORDER — OMEPRAZOLE 20 MG PO CPDR
20.0000 mg | DELAYED_RELEASE_CAPSULE | Freq: Every day | ORAL | 2 refills | Status: DC
Start: 2016-09-17 — End: 2017-02-25

## 2016-09-17 MED ORDER — CEFAZOLIN SODIUM-DEXTROSE 2-4 GM/100ML-% IV SOLN
2.0000 g | Freq: Four times a day (QID) | INTRAVENOUS | Status: AC
  Administered 2016-09-17 – 2016-09-18 (×2): 2 g via INTRAVENOUS
  Filled 2016-09-17 (×2): qty 100

## 2016-09-17 MED ORDER — LACTATED RINGERS IV SOLN: INTRAVENOUS | Status: DC

## 2016-09-17 MED ORDER — DEXAMETHASONE SODIUM PHOSPHATE 4 MG/ML IJ SOLN
INTRAMUSCULAR | Status: DC | PRN
  Administered 2016-09-17: 10 mg via INTRAVENOUS

## 2016-09-17 MED ORDER — OXYCODONE-ACETAMINOPHEN 5-325 MG PO TABS: 1.0000 | ORAL_TABLET | ORAL | 0 refills | Status: DC | PRN

## 2016-09-17 MED ORDER — KETOROLAC TROMETHAMINE 30 MG/ML IJ SOLN
INTRAMUSCULAR | Status: DC | PRN
  Administered 2016-09-17: 30 mg

## 2016-09-17 MED ORDER — ACETAMINOPHEN 500 MG PO TABS
1000.0000 mg | ORAL_TABLET | Freq: Once | ORAL | Status: AC
  Administered 2016-09-17: 1000 mg via ORAL
  Filled 2016-09-17: qty 2

## 2016-09-17 MED ORDER — DOCUSATE SODIUM 100 MG PO CAPS
100.0000 mg | ORAL_CAPSULE | Freq: Two times a day (BID) | ORAL | Status: DC
  Administered 2016-09-17 – 2016-09-18 (×2): 100 mg via ORAL
  Filled 2016-09-17 (×2): qty 1

## 2016-09-17 SURGICAL SUPPLY — 52 items
BAG DECANTER FOR FLEXI CONT (MISCELLANEOUS) ×2 IMPLANT
BLADE SAG 18X100X1.27 (BLADE) ×2 IMPLANT
BLADE SAW SGTL 18X1.27X75 (BLADE) IMPLANT
CAPT HIP TOTAL 2 ×2 IMPLANT
CLSR STERI-STRIP ANTIMIC 1/2X4 (GAUZE/BANDAGES/DRESSINGS) ×2 IMPLANT
COVER PERINEAL POST (MISCELLANEOUS) ×2 IMPLANT
COVER SURGICAL LIGHT HANDLE (MISCELLANEOUS) ×2 IMPLANT
DRAPE C-ARM 42X72 X-RAY (DRAPES) ×2 IMPLANT
DRAPE STERI IOBAN 125X83 (DRAPES) ×2 IMPLANT
DRAPE U-SHAPE 47X51 STRL (DRAPES) ×4 IMPLANT
DRSG MEPILEX BORDER 4X12 (GAUZE/BANDAGES/DRESSINGS) ×2 IMPLANT
DRSG MEPILEX BORDER 4X8 (GAUZE/BANDAGES/DRESSINGS) ×2 IMPLANT
DURAPREP 26ML APPLICATOR (WOUND CARE) ×2 IMPLANT
ELECT BLADE 4.0 EZ CLEAN MEGAD (MISCELLANEOUS) ×2
ELECT REM PT RETURN 9FT ADLT (ELECTROSURGICAL) ×2
ELECTRODE BLDE 4.0 EZ CLN MEGD (MISCELLANEOUS) ×1 IMPLANT
ELECTRODE REM PT RTRN 9FT ADLT (ELECTROSURGICAL) ×1 IMPLANT
FACESHIELD WRAPAROUND (MASK) ×4 IMPLANT
GLOVE BIO SURGEON STRL SZ7.5 (GLOVE) ×4 IMPLANT
GLOVE BIOGEL PI IND STRL 7.0 (GLOVE) ×1 IMPLANT
GLOVE BIOGEL PI IND STRL 8 (GLOVE) ×2 IMPLANT
GLOVE BIOGEL PI INDICATOR 7.0 (GLOVE) ×1
GLOVE BIOGEL PI INDICATOR 8 (GLOVE) ×2
GLOVE SURG SS PI 7.0 STRL IVOR (GLOVE) ×2 IMPLANT
GOWN STRL REUS W/ TWL LRG LVL3 (GOWN DISPOSABLE) ×2 IMPLANT
GOWN STRL REUS W/TWL LRG LVL3 (GOWN DISPOSABLE) ×2
KIT BASIN OR (CUSTOM PROCEDURE TRAY) ×2 IMPLANT
KIT ROOM TURNOVER OR (KITS) ×2 IMPLANT
MANIFOLD NEPTUNE II (INSTRUMENTS) ×2 IMPLANT
NDL SAFETY ECLIPSE 18X1.5 (NEEDLE) ×1 IMPLANT
NEEDLE HYPO 18GX1.5 SHARP (NEEDLE) ×1
NEEDLE HYPO 22GX1.5 SAFETY (NEEDLE) ×2 IMPLANT
NS IRRIG 1000ML POUR BTL (IV SOLUTION) ×2 IMPLANT
PACK TOTAL JOINT (CUSTOM PROCEDURE TRAY) ×2 IMPLANT
PACK UNIVERSAL I (CUSTOM PROCEDURE TRAY) ×2 IMPLANT
PAD ARMBOARD 7.5X6 YLW CONV (MISCELLANEOUS) ×2 IMPLANT
SPONGE LAP 18X18 X RAY DECT (DISPOSABLE) IMPLANT
STRIP CLOSURE SKIN 1/2X4 (GAUZE/BANDAGES/DRESSINGS) ×2 IMPLANT
SUT MNCRL AB 4-0 PS2 18 (SUTURE) ×2 IMPLANT
SUT MON AB 2-0 CT1 36 (SUTURE) ×2 IMPLANT
SUT VIC AB 0 CT1 27 (SUTURE) ×1
SUT VIC AB 0 CT1 27XBRD ANBCTR (SUTURE) ×1 IMPLANT
SUT VIC AB 1 CT1 27 (SUTURE) ×1
SUT VIC AB 1 CT1 27XBRD ANBCTR (SUTURE) ×1 IMPLANT
SYR 50ML LL SCALE MARK (SYRINGE) ×4 IMPLANT
SYRINGE 20CC LL (MISCELLANEOUS) IMPLANT
TOWEL OR 17X24 6PK STRL BLUE (TOWEL DISPOSABLE) ×2 IMPLANT
TOWEL OR 17X26 10 PK STRL BLUE (TOWEL DISPOSABLE) ×2 IMPLANT
TRAY CATH 16FR W/PLASTIC CATH (SET/KITS/TRAYS/PACK) IMPLANT
TRAY FOLEY W/METER SILVER 16FR (SET/KITS/TRAYS/PACK) IMPLANT
WATER STERILE IRR 1000ML POUR (IV SOLUTION) ×2 IMPLANT
YANKAUER SUCT BULB TIP NO VENT (SUCTIONS) ×2 IMPLANT

## 2016-09-17 NOTE — Anesthesia Procedure Notes (Signed)
Procedure Name: Intubation Date/Time: 09/17/2016 1:00 PM Performed by: Ollen Bowl Pre-anesthesia Checklist: Patient identified, Emergency Drugs available and Suction available Patient Re-evaluated:Patient Re-evaluated prior to inductionOxygen Delivery Method: Circle system utilized Preoxygenation: Pre-oxygenation with 100% oxygen Intubation Type: IV induction Ventilation: Mask ventilation without difficulty Laryngoscope Size: Miller and 2 Grade View: Grade I Tube size: 7.5 mm Number of attempts: 1 Placement Confirmation: ETT inserted through vocal cords under direct vision,  positive ETCO2 and breath sounds checked- equal and bilateral Secured at: 23 cm Tube secured with: Tape Dental Injury: Teeth and Oropharynx as per pre-operative assessment

## 2016-09-17 NOTE — Anesthesia Procedure Notes (Signed)
Date/Time: 09/17/2016 1:00 PM Performed by: Ollen Bowl

## 2016-09-17 NOTE — Discharge Instructions (Signed)

## 2016-09-17 NOTE — Op Note (Signed)
09/17/2016  2:21 PM  PATIENT:  Erin Donovan   MRN: [id number]  PRE-OPERATIVE DIAGNOSIS:  OA RIGHT HIP  POST-OPERATIVE DIAGNOSIS:  OA RIGHT HIP  PROCEDURE:  Procedure(s): RIGHT TOTAL HIP ARTHROPLASTY ANTERIOR APPROACH  PREOPERATIVE INDICATIONS:    Erin Donovan is an 61 y.o. female who has a diagnosis of Osteoarthritis of right hip and elected for surgical management after failing conservative treatment.  The risks benefits and alternatives were discussed with the patient including but not limited to the risks of nonoperative treatment, versus surgical intervention including infection, bleeding, nerve injury, periprosthetic fracture, the need for revision surgery, dislocation, leg length discrepancy, blood clots, cardiopulmonary complications, morbidity, mortality, among others, and they were willing to proceed.     OPERATIVE REPORT     SURGEON:   Hoorain Kozakiewicz, Ernesta Amble, MD    ASSISTANT:  Roxan Hockey, PA-C, he was present and scrubbed throughout the case, critical for completion in a timely fashion, and for retraction, instrumentation, and closure.     ANESTHESIA:  General    COMPLICATIONS:  None.     COMPONENTS:  Stryker acolade fit femur size 6 with a 36 mm -5 head ball and a PSL acetabular shell size 52 with a  polyethylene liner    PROCEDURE IN DETAIL:   The patient was met in the holding area and  identified.  The appropriate hip was identified and marked at the operative site.  The patient was then transported to the OR  and  placed under anesthesia per that record.  At that point, the patient was  placed in the supine position and  secured to the operating room table and all bony prominences padded. He received pre-operative antibiotics    The operative lower extremity was prepped from the iliac crest to the distal leg.  Sterile draping was performed.  Time out was performed prior to incision.      Skin incision was made just 2 cm lateral to the ASIS  extending in line  with the tensor fascia lata. Electrocautery was used to control all bleeders. I dissected down sharply to the fascia of the tensor fascia lata was confirmed that the muscle fibers beneath were running posteriorly. I then incised the fascia over the superficial tensor fascia lata in line with the incision. The fascia was elevated off the anterior aspect of the muscle the muscle was retracted posteriorly and protected throughout the case. I then used electrocautery to incise the tensor fascia lata fascia control and all bleeders. Immediately visible was the fat over top of the anterior neck and capsule.  I removed the anterior fat from the capsule and elevated the rectus muscle off of the anterior capsule. I then removed a large time of capsule. The retractors were then placed over the anterior acetabulum as well as around the superior and inferior neck.  I then removed a section of the femoral neck and a napkin ring fashion. Then used the power course to remove the femoral head from the acetabulum and thoroughly irrigated the acetabulum. I sized the femoral head.    I then exposed the deep acetabulum, cleared out any tissue including the ligamentum teres.   After adequate visualization, I excised the labrum, and then sequentially reamed.  I then impacted the acetabular implant into place using fluoroscopy for guidance.  Appropriate version and inclination was confirmed clinically matching their bony anatomy, and with fluoroscopy.  I placed a 20 mm screw in the posterior/superio position with an excellent bite.  I then placed the polyethylene liner in place  I then adducted the leg and released the external rotators from the posterior femur allowing it to be easily delivered up lateral and anterior to the acetabulum for preparation of the femoral canal.    I then prepared the proximal femur using the cookie-cutter and then sequentially reamed and broached.  A trial broach, neck, and head was  utilized, and I reduced the hip and used floroscopy to assess the neck length and femoral implant.  I then impacted the femoral prosthesis into place into the appropriate version. The hip was then reduced and fluoroscopy confirmed appropriate position. Leg lengths were restored.  I then irrigated the hip copiously again with, and repaired the fascia with Vicryl, followed by monocryl for the subcutaneous tissue, Monocryl for the skin, Steri-Strips and sterile gauze. The patient was then awakened and returned to PACU in stable and satisfactory condition. There were no complications.  POST OPERATIVE PLAN: WBAT, DVT px: SCD's/TED, ambulation and chemical dvt px  Hargun Spurling, MD Orthopedic Surgeon [phone]     

## 2016-09-17 NOTE — Anesthesia Postprocedure Evaluation (Signed)
Anesthesia Post Note  Patient: Erin Donovan  Procedure(s) Performed: Procedure(s) (LRB): RIGHT TOTAL HIP ARTHROPLASTY ANTERIOR APPROACH (Right)  Patient location during evaluation: PACU Anesthesia Type: General Level of consciousness: awake Pain management: pain level controlled Vital Signs Assessment: post-procedure vital signs reviewed and stable Respiratory status: spontaneous breathing Cardiovascular status: stable Anesthetic complications: no       Last Vitals:  Vitals:   09/17/16 1545 09/17/16 1600  BP: (!) 104/59 (!) 98/59  Pulse: 62 70  Resp: 11 15  Temp:      Last Pain:  Vitals:   09/17/16 1600  TempSrc:   PainSc: Asleep                 Chantil Bari

## 2016-09-17 NOTE — Interval H&P Note (Signed)
History and Physical Interval Note:  09/17/2016 11:47 AM  Erin Donovan  has presented today for surgery, with the diagnosis of OA RIGHT HIP  The various methods of treatment have been discussed with the patient and family. After consideration of risks, benefits and other options for treatment, the patient has consented to  Procedure(s): RIGHT TOTAL HIP ARTHROPLASTY (Right) as a surgical intervention .  The patient's history has been reviewed, patient examined, no change in status, stable for surgery.  I have reviewed the patient's chart and labs.  Questions were answered to the patient's satisfaction.     [name]

## 2016-09-17 NOTE — Anesthesia Preprocedure Evaluation (Signed)
Anesthesia Evaluation  Patient identified by MRN, date of birth, ID band Patient awake    Reviewed: Allergy & Precautions, NPO status , Patient's Chart, lab work & pertinent test results  Airway Mallampati: II  TM Distance: >3 FB     Dental   Pulmonary Current Smoker,    breath sounds clear to auscultation       Cardiovascular negative cardio ROS   Rhythm:Regular Rate:Normal     Neuro/Psych    GI/Hepatic negative GI ROS, Neg liver ROS,   Endo/Other  negative endocrine ROS  Renal/GU negative Renal ROS     Musculoskeletal  (+) Arthritis ,   Abdominal   Peds  Hematology   Anesthesia Other Findings   Reproductive/Obstetrics                             Anesthesia Physical Anesthesia Plan  ASA: III  Anesthesia Plan: General   Post-op Pain Management:    Induction: Intravenous  Airway Management Planned: Oral ETT  Additional Equipment:   Intra-op Plan:   Post-operative Plan: Extubation in OR  Informed Consent: I have reviewed the patients History and Physical, chart, labs and discussed the procedure including the risks, benefits and alternatives for the proposed anesthesia with the patient or authorized representative who has indicated his/her understanding and acceptance.   Dental advisory given  Plan Discussed with: CRNA and Anesthesiologist  Anesthesia Plan Comments:         Anesthesia Quick Evaluation

## 2016-09-17 NOTE — Transfer of Care (Signed)
Immediate Anesthesia Transfer of Care Note  Patient: Erin Donovan  Procedure(s) Performed: Procedure(s): RIGHT TOTAL HIP ARTHROPLASTY ANTERIOR APPROACH (Right)  Patient Location: PACU  Anesthesia Type:General  Level of Consciousness: awake, alert  and oriented  Airway & Oxygen Therapy: Patient Spontanous Breathing and Patient connected to nasal cannula oxygen  Post-op Assessment: Report given to RN and Post -op Vital signs reviewed and stable  Post vital signs: Reviewed and stable  Last Vitals:  Vitals:   09/17/16 1124 09/17/16 1503  BP: 131/72 114/66  Pulse: 71 85  Resp: 16 19  Temp: 36.6 C 36.3 C    Last Pain:  Vitals:   09/17/16 1124  TempSrc: Oral  PainSc: 2          Complications: No apparent anesthesia complications

## 2016-09-18 ENCOUNTER — Encounter (HOSPITAL_COMMUNITY): Payer: Self-pay | Admitting: Orthopedic Surgery

## 2016-09-18 LAB — CBC
HCT: 31.3 % — ABNORMAL LOW (ref 36.0–46.0)
Hemoglobin: 10.3 g/dL — ABNORMAL LOW (ref 12.0–15.0)
MCH: 29.3 pg (ref 26.0–34.0)
MCHC: 32.9 g/dL (ref 30.0–36.0)
MCV: 88.9 fL (ref 78.0–100.0)
PLATELETS: 255 10*3/uL (ref 150–400)
RBC: 3.52 MIL/uL — ABNORMAL LOW (ref 3.87–5.11)
RDW: 13.2 % (ref 11.5–15.5)
WBC: 13.1 10*3/uL — AB (ref 4.0–10.5)

## 2016-09-18 NOTE — Progress Notes (Signed)
Discharge instructions printed and reviewed with patient and spouse, and copy given for them to take home. All questions addressed at this time. New prescriptions reviewed. IV removed. Room searched for patient belongings, and confirmed with patient that all valuables were accounted for. Spouse assisted patient to dress, then staff escorted patient to discharge via wheelchair.

## 2016-09-18 NOTE — Discharge Summary (Signed)
Discharge Summary  Patient ID: Erin Donovan MRN: [id number] DOB/AGE: 60-Jun-1957 60 y.o.  Admit date: 09/17/2016 Discharge date: 09/18/2016  Admission Diagnoses:  Osteoarthritis of right hip  Discharge Diagnoses:  Principal Problem:   Osteoarthritis of right hip Active Problems:   Hx of compression fracture of spine   Smoker   Primary osteoarthritis of right hip   Past Medical History:  Diagnosis Date  . Basal cell carcinoma of skin of face    "right side"  . Chronic lumbar pain    "broke L1 last year" (09/17/2016)  . Osteoarthritis    "hips" (09/17/2016)  . Osteosclerosis   . Right hip pain     Surgeries: Procedure(s): RIGHT TOTAL HIP ARTHROPLASTY ANTERIOR APPROACH on 09/17/2016   Consultants (if any):   Discharged Condition: Improved  Hospital Course: Erin Donovan is an 61 y.o. female who was admitted 09/17/2016 with a diagnosis of Osteoarthritis of right hip and went to the operating room on 09/17/2016 and underwent the above named procedures.    She was given perioperative antibiotics:  Anti-infectives    Start     Dose/Rate Route Frequency Ordered Stop   09/17/16 2000  ceFAZolin (ANCEF) IVPB 2g/100 mL premix     2 g 200 mL/hr over 30 Minutes Intravenous Every 6 hours 09/17/16 1832 09/18/16 0235   09/17/16 1113  ceFAZolin (ANCEF) IVPB 2g/100 mL premix     2 g 200 mL/hr over 30 Minutes Intravenous On call to O.R. 09/17/16 1113 09/17/16 1302    .  She was given sequential compression devices, early ambulation, and ASA 325 mg for DVT prophylaxis.  She benefited maximally from the hospital stay and there were no complications.    Recent vital signs:  Vitals:   09/18/16 0035 09/18/16 0526  BP: 124/63 125/72  Pulse: 82 69  Resp: 16 16  Temp: 97.9 F (36.6 C) 98.5 F (36.9 C)    Recent laboratory studies:  Lab Results  Component Value Date   HGB 10.3 (L) 09/18/2016   HGB 13.5 09/06/2016   Lab Results  Component Value Date   WBC 13.1 (H) 09/18/2016    PLT 255 09/18/2016   No results found for: INR Lab Results  Component Value Date   NA 137 09/06/2016   K 4.1 09/06/2016   CL 104 09/06/2016   CO2 27 09/06/2016   BUN 13 09/06/2016   CREATININE 0.79 09/06/2016   GLUCOSE 98 09/06/2016    Discharge Medications:   Allergies as of 09/18/2016      Reactions   No Known Allergies       Medication List    STOP taking these medications   azithromycin 250 MG tablet Commonly known as:  ZITHROMAX   ibuprofen 200 MG tablet Commonly known as:  ADVIL,MOTRIN     TAKE these medications   aspirin EC 325 MG tablet Take 1 tablet (325 mg total) by mouth daily. For 30 days post op for DVT Prophylaxis   Biotin 10000 MCG Tabs Take 1 tablet by mouth daily.   cetirizine 10 MG tablet Commonly known as:  ZYRTEC Take 10 mg by mouth as needed for allergies.   diazepam 5 MG tablet Commonly known as:  VALIUM Take 5 mg by mouth daily as needed for anxiety.   docusate sodium 100 MG capsule Commonly known as:  COLACE Take 1 capsule (100 mg total) by mouth 2 (two) times daily. To prevent constipation while taking pain medication.   fluticasone 50 MCG/ACT nasal spray  Commonly known as:  FLONASE Place 2 sprays into both nostrils daily.   FORTEO Ragan Inject 20 mg into the skin daily. On hold for the procedure   HYDROcodone-acetaminophen 5-325 MG tablet Commonly known as:  NORCO/VICODIN Take 1 tablet by mouth daily as needed for moderate pain.   magnesium oxide 400 MG tablet Commonly known as:  MAG-OX Take 1 tablet (400 mg total) by mouth daily.   meloxicam 15 MG tablet Commonly known as:  MOBIC Take 15 mg by mouth daily.   methocarbamol 500 MG tablet Commonly known as:  ROBAXIN Take 1 tablet (500 mg total) by mouth every 6 (six) hours as needed for muscle spasms.   omeprazole 20 MG capsule Commonly known as:  PRILOSEC Take 1 capsule (20 mg total) by mouth daily. While taking anti inflammatory medicine daily   ondansetron 4 MG  tablet Commonly known as:  ZOFRAN Take 1 tablet (4 mg total) by mouth every 8 (eight) hours as needed for nausea or vomiting.   OVER THE COUNTER MEDICATION Take 2 tablets by mouth daily. Juice Plus Fruit Blend   OVER THE COUNTER MEDICATION Take 2 tablets by mouth daily. Juice Plus Vegetable blend   OVER THE COUNTER MEDICATION Take 2 tablets by mouth daily. Juice Plus Vineyard blend   oxyCODONE-acetaminophen 5-325 MG tablet Commonly known as:  ROXICET Take 1-2 tablets by mouth every 4 (four) hours as needed for severe pain.   VIACTIV [phone] MG-UNT-MCG Chew Generic drug:  Calcium-Vitamin D-Vitamin K Chew 2 tablets by mouth daily.   Vitamin D3 5000 units Caps Take 1 capsule (5,000 Units total) by mouth daily.       Diagnostic Studies: Dg C-arm 1-60 Min  Result Date: 09/17/2016 CLINICAL DATA:  Right total hip arthroplasty EXAM: OPERATIVE right HIP (WITH PELVIS IF PERFORMED) 2 VIEWS TECHNIQUE: Fluoroscopic spot image(s) were submitted for interpretation post-operatively. COMPARISON:  None. FINDINGS: Two views of the right hip submitted. There is right hip prosthesis with anatomic alignment. IMPRESSION: Right hip prosthesis with anatomic alignment. Fluoroscopy time 21 seconds. Electronically Signed   By: Lahoma Crocker M.D.   On: 09/17/2016 14:47   Dg Hip Operative Unilat W Or W/o Pelvis Right  Result Date: 09/17/2016 CLINICAL DATA:  Right total hip arthroplasty EXAM: OPERATIVE right HIP (WITH PELVIS IF PERFORMED) 2 VIEWS TECHNIQUE: Fluoroscopic spot image(s) were submitted for interpretation post-operatively. COMPARISON:  None. FINDINGS: Two views of the right hip submitted. There is right hip prosthesis with anatomic alignment. IMPRESSION: Right hip prosthesis with anatomic alignment. Fluoroscopy time 21 seconds. Electronically Signed   By: Lahoma Crocker M.D.   On: 09/17/2016 14:47    Disposition: 01-Home or Self Care    Follow-up Information    MURPHY, TIMOTHY D, MD Follow up.    Specialty:  Orthopedic Surgery Contact information: [address] [place] [address] [phone]           Signed: Prudencio Burly III PA-C 09/18/2016, 6:51 AM

## 2016-09-18 NOTE — Evaluation (Signed)
Physical Therapy Evaluation Patient Details Name: Erin Donovan MRN: [id number] DOB: August 27, 1955 Today's Date: 09/18/2016   History of Present Illness  Admitted for THA, Direct Anterior approach; WBAT;  has a past medical history of Basal cell carcinoma of skin of face; Chronic lumbar pain; Osteoarthritis; Osteosclerosis; and Right hip pain.  has a past surgical history that includes Total hip arthroplasty (Right, 09/17/2016); Joint replacement; and Excision basal cell carcinoma (1990s).  Clinical Impression   Pt is s/p THA resulting in the deficits listed below (see PT Problem List).  Pt will benefit from skilled PT to increase their independence and safety with mobility to allow discharge to the venue listed below.      Follow Up Recommendations Home health PT    Equipment Recommendations  Rolling walker with 5" wheels;3in1 (PT)    Recommendations for Other Services       Precautions / Restrictions Precautions Precautions: None Restrictions Weight Bearing Restrictions: No RLE Weight Bearing: Weight bearing as tolerated      Mobility  Bed Mobility Overal bed mobility: Needs Assistance Bed Mobility: Supine to Sit     Supine to sit: Supervision     General bed mobility comments: Cues for technqiue  Transfers Overall transfer level: Needs assistance Equipment used: Rolling walker (2 wheeled) Transfers: Sit to/from Stand Sit to Stand: Min guard         General transfer comment: Cues for hand placement and safety  Ambulation/Gait Ambulation/Gait assistance: Min guard Ambulation Distance (Feet): 120 Feet Assistive device: Rolling walker (2 wheeled) Gait Pattern/deviations: Step-through pattern     General Gait Details: Cues to self-monitor for activity tolerance  Stairs            Wheelchair Mobility    Modified Rankin (Stroke Patients Only)       Balance                                             Pertinent Vitals/Pain  Pain Assessment: Faces Faces Pain Scale: Hurts even more Pain Location: R hip with motion Pain Descriptors / Indicators: Aching Pain Intervention(s): Limited activity within patient's tolerance;Monitored during session;Patient requesting pain meds-RN notified    Home Living Family/patient expects to be discharged to:: Private residence Living Arrangements: Spouse/significant other Available Help at Discharge: Family;Available 24 hours/day Type of Home: House Home Access: Stairs to enter Entrance Stairs-Rails: Psychiatric nurse of Steps: 3 Home Layout: One level Home Equipment: None      Prior Function Level of Independence: Independent               Hand Dominance        Extremity/Trunk Assessment   Upper Extremity Assessment Upper Extremity Assessment: Overall WFL for tasks assessed    Lower Extremity Assessment Lower Extremity Assessment: RLE deficits/detail RLE Deficits / Details: Grossly decr AROM and strength, limited by pain       Communication   Communication: No difficulties  Cognition Arousal/Alertness: Awake/alert Behavior During Therapy: WFL for tasks assessed/performed Overall Cognitive Status: Within Functional Limits for tasks assessed                      General Comments      Exercises     Assessment/Plan    PT Assessment Patient needs continued PT services  PT Problem List Decreased strength;Decreased range of motion;Decreased activity tolerance;Decreased balance;Decreased mobility;Decreased  knowledge of use of DME;Pain;Decreased knowledge of precautions       PT Treatment Interventions DME instruction;Gait training;Stair training;Functional mobility training;Therapeutic activities;Therapeutic exercise;Patient/family education    PT Goals (Current goals can be found in the Care Plan section)  Acute Rehab PT Goals Patient Stated Goal: back to the treadmill PT Goal Formulation: With patient Time For Goal  Achievement: 09/25/16 Potential to Achieve Goals: Good    Frequency 7X/week   Barriers to discharge        Co-evaluation               End of Session Equipment Utilized During Treatment: Gait belt Activity Tolerance: Patient tolerated treatment well Patient left: in chair;with call bell/phone within reach;with nursing/sitter in room Nurse Communication: Mobility status;Patient requests pain meds PT Visit Diagnosis: Other abnormalities of gait and mobility (R26.89);Pain Pain - Right/Left: Right Pain - part of body: Hip         Time: 9450-3888 PT Time Calculation (min) (ACUTE ONLY): 21 min   Charges:   PT Evaluation $PT Eval Low Complexity: 1 Procedure     PT G CodesColletta Donovan 09/18/2016, 1:54 PM  Roney Marion, PT  Acute Rehabilitation Services Pager [phone] Office [phone]

## 2016-09-18 NOTE — Progress Notes (Signed)
   Assessment: 1 Day Post-Op  S/P Procedure(s) (LRB): RIGHT TOTAL HIP ARTHROPLASTY ANTERIOR APPROACH (Right) by Dr. Ernesta Amble. Percell Miller on 09/17/16  Principal Problem:   Osteoarthritis of right hip Active Problems:   Hx of compression fracture of spine   Smoker   Primary osteoarthritis of right hip  Acute Blood Loss Anemia, mild asymptomatic   Plan: Up with therapy D/C IV fluids Discharge home with home health  Weight Bearing: Weight Bearing as Tolerated (WBAT)  Dressings: Mepilex.  VTE prophylaxis: Aspirin, SCDs, ambulation Dispo: Home today pending PT evaluation  Subjective: Patient reports pain as mild. Pain controlled with PO meds.  Tolerating diet.  Urinating.  No CP, SOB.  OOB in room.  Objective:   VITALS:   Vitals:   09/17/16 1900 09/17/16 2054 09/18/16 0035 09/18/16 0526  BP: 117/60 127/69 124/63 125/72  Pulse: 66 84 82 69  Resp: 16 16 16 16   Temp: 97.6 F (36.4 C) 97.9 F (36.6 C) 97.9 F (36.6 C) 98.5 F (36.9 C)  TempSrc: Oral Oral Oral Oral  SpO2: 98% 99% 99% 99%  Weight:      Height:       CBC Latest Ref Rng & Units 09/18/2016 09/06/2016 07/31/2016  WBC 4.0 - 10.5 K/uL 13.1(H) 7.1 7.9  Hemoglobin 12.0 - 15.0 g/dL 10.3(L) 13.5 -  Hematocrit 36.0 - 46.0 % 31.3(L) 40.9 41.7  Platelets 150 - 400 K/uL 255 278 313   BMP Latest Ref Rng & Units 09/06/2016 07/31/2016  Glucose 65 - 99 mg/dL 98 83  BUN 6 - 20 mg/dL 13 16  Creatinine 0.44 - 1.00 mg/dL 0.79 0.79  BUN/Creat Ratio 9 - 23 - 20  Sodium 135 - 145 mmol/L 137 141  Potassium 3.5 - 5.1 mmol/L 4.1 4.8  Chloride 101 - 111 mmol/L 104 101  CO2 22 - 32 mmol/L 27 22  Calcium 8.9 - 10.3 mg/dL 9.3 9.4   Intake/Output      03/20 0701 - 03/21 0700   I.V. (mL/kg) 2000 (25.1)   Total Intake(mL/kg) 2000 (25.1)   Urine (mL/kg/hr) 250   Blood 450   Total Output 700   Net +1300       Urine Occurrence 2 x    Physical Exam: General: NAD.   Resp: No increased wob Cardio: regular rate and rhythm ABD  soft Neurologically intact MSK Neurovascularly intact Sensation intact distally Feet warm Dorsiflexion/Plantar flexion intact Incision: dressing C/D/I   Prudencio Burly III, PA-C 09/18/2016, 6:48 AM

## 2016-09-18 NOTE — Progress Notes (Signed)
Physical Therapy Treatment Patient Details Name: Erin Donovan MRN: [id number] DOB: [birth year] Today's Date: 09/18/2016    History of Present Illness Admitted for THA, Direct Anterior approach; WBAT;  has a past medical history of Basal cell carcinoma of skin of face; Chronic lumbar pain; Osteoarthritis; Osteosclerosis; and Right hip pain.  has a past surgical history that includes Total hip arthroplasty (Right, 09/17/2016); Joint replacement; and Excision basal cell carcinoma (1990s).    PT Comments    Moving very well; stair training complete; OK for dc home from PT standpoint    Follow Up Recommendations  Home health PT     Equipment Recommendations  Rolling walker with 5" wheels;3in1 (PT)    Recommendations for Other Services       Precautions / Restrictions Precautions Precautions: None Restrictions RLE Weight Bearing: Weight bearing as tolerated    Mobility  Bed Mobility                  Transfers Overall transfer level: Needs assistance Equipment used: Rolling walker (2 wheeled) Transfers: Sit to/from Stand Sit to Stand: Supervision         General transfer comment: Cues for hand placement and safety  Ambulation/Gait Ambulation/Gait assistance: Supervision Ambulation Distance (Feet): 550 Feet Assistive device: Rolling walker (2 wheeled) Gait Pattern/deviations: Step-through pattern   Gait velocity interpretation: at or above normal speed for age/gender General Gait Details: Cues to self-monitor for activity tolerance   Stairs Stairs: Yes   Stair Management: One rail Right;Step to pattern;Forwards Number of Stairs: 3 General stair comments: no difficulty  Wheelchair Mobility    Modified Rankin (Stroke Patients Only)       Balance                                    Cognition Arousal/Alertness: Awake/alert Behavior During Therapy: WFL for tasks assessed/performed Overall Cognitive Status: Within Functional Limits  for tasks assessed                      Exercises      General Comments General comments (skin integrity, edema, etc.): Pt and husband had no questions re: exercise sheet      Pertinent Vitals/Pain Pain Assessment: 0-10 Pain Score: 1  Pain Location: R hip with motion Pain Descriptors / Indicators: Aching Pain Intervention(s): Monitored during session    Home Living                      Prior Function            PT Goals (current goals can now be found in the care plan section) Acute Rehab PT Goals Patient Stated Goal: back to the treadmill PT Goal Formulation: With patient Time For Goal Achievement: 09/25/16 Potential to Achieve Goals: Good Progress towards PT goals: Progressing toward goals    Frequency    7X/week      PT Plan Current plan remains appropriate    Co-evaluation             End of Session Equipment Utilized During Treatment: Gait belt Activity Tolerance: Patient tolerated treatment well Patient left: Other (comment) (walking with husband in room) Nurse Communication: Mobility status PT Visit Diagnosis: Other abnormalities of gait and mobility (R26.89);Pain Pain - Right/Left: Right Pain - part of body: Hip     Time: 7654-6503 PT Time Calculation (min) (ACUTE ONLY): 11 min  Charges:  $Gait Training: 8-22 mins                    G Codes:       Colletta Maryland 09/23/2016, 4:27 PM  Roney Marion, [place] Pager [phone] Office [phone]

## 2016-09-18 NOTE — Plan of Care (Signed)
Problem: Pain Managment: Goal: General experience of comfort will improve Outcome: Progressing Denies pain   Problem: Skin Integrity: Goal: Risk for impaired skin integrity will decrease Outcome: Progressing No skin issues  Problem: Tissue Perfusion: Goal: Risk factors for ineffective tissue perfusion will decrease Outcome: Progressing No S/S of DVT, SCDs are on  Problem: Activity: Goal: Risk for activity intolerance will decrease Outcome: Progressing Up to [facility] with minimum assistance, tolerates well  Problem: Bowel/Gastric: Goal: Will not experience complications related to bowel motility Outcome: Progressing No bowel or gastric issues reported

## 2016-09-20 DIAGNOSIS — Z7982 Long term (current) use of aspirin: Secondary | ICD-10-CM | POA: Diagnosis not present

## 2016-09-20 DIAGNOSIS — Z96641 Presence of right artificial hip joint: Secondary | ICD-10-CM | POA: Diagnosis not present

## 2016-09-20 DIAGNOSIS — Z79891 Long term (current) use of opiate analgesic: Secondary | ICD-10-CM | POA: Diagnosis not present

## 2016-09-20 DIAGNOSIS — Z8781 Personal history of (healed) traumatic fracture: Secondary | ICD-10-CM | POA: Diagnosis not present

## 2016-09-20 DIAGNOSIS — F17210 Nicotine dependence, cigarettes, uncomplicated: Secondary | ICD-10-CM | POA: Diagnosis not present

## 2016-09-20 DIAGNOSIS — Z471 Aftercare following joint replacement surgery: Secondary | ICD-10-CM | POA: Diagnosis not present

## 2016-09-23 DIAGNOSIS — Z96641 Presence of right artificial hip joint: Secondary | ICD-10-CM | POA: Diagnosis not present

## 2016-09-23 DIAGNOSIS — Z7982 Long term (current) use of aspirin: Secondary | ICD-10-CM | POA: Diagnosis not present

## 2016-09-23 DIAGNOSIS — Z471 Aftercare following joint replacement surgery: Secondary | ICD-10-CM | POA: Diagnosis not present

## 2016-09-23 DIAGNOSIS — Z8781 Personal history of (healed) traumatic fracture: Secondary | ICD-10-CM | POA: Diagnosis not present

## 2016-09-23 DIAGNOSIS — Z79891 Long term (current) use of opiate analgesic: Secondary | ICD-10-CM | POA: Diagnosis not present

## 2016-09-23 DIAGNOSIS — F17210 Nicotine dependence, cigarettes, uncomplicated: Secondary | ICD-10-CM | POA: Diagnosis not present

## 2016-09-24 ENCOUNTER — Ambulatory Visit: Payer: Self-pay | Admitting: Obstetrics and Gynecology

## 2016-09-26 DIAGNOSIS — Z7982 Long term (current) use of aspirin: Secondary | ICD-10-CM | POA: Diagnosis not present

## 2016-09-26 DIAGNOSIS — Z8781 Personal history of (healed) traumatic fracture: Secondary | ICD-10-CM | POA: Diagnosis not present

## 2016-09-26 DIAGNOSIS — Z471 Aftercare following joint replacement surgery: Secondary | ICD-10-CM | POA: Diagnosis not present

## 2016-09-26 DIAGNOSIS — F17210 Nicotine dependence, cigarettes, uncomplicated: Secondary | ICD-10-CM | POA: Diagnosis not present

## 2016-09-26 DIAGNOSIS — Z96641 Presence of right artificial hip joint: Secondary | ICD-10-CM | POA: Diagnosis not present

## 2016-09-26 DIAGNOSIS — Z79891 Long term (current) use of opiate analgesic: Secondary | ICD-10-CM | POA: Diagnosis not present

## 2016-09-27 DIAGNOSIS — Z7982 Long term (current) use of aspirin: Secondary | ICD-10-CM | POA: Diagnosis not present

## 2016-09-27 DIAGNOSIS — F17210 Nicotine dependence, cigarettes, uncomplicated: Secondary | ICD-10-CM | POA: Diagnosis not present

## 2016-09-27 DIAGNOSIS — Z96641 Presence of right artificial hip joint: Secondary | ICD-10-CM | POA: Diagnosis not present

## 2016-09-27 DIAGNOSIS — Z8781 Personal history of (healed) traumatic fracture: Secondary | ICD-10-CM | POA: Diagnosis not present

## 2016-09-27 DIAGNOSIS — Z471 Aftercare following joint replacement surgery: Secondary | ICD-10-CM | POA: Diagnosis not present

## 2016-09-27 DIAGNOSIS — Z79891 Long term (current) use of opiate analgesic: Secondary | ICD-10-CM | POA: Diagnosis not present

## 2016-09-30 DIAGNOSIS — Z79891 Long term (current) use of opiate analgesic: Secondary | ICD-10-CM | POA: Diagnosis not present

## 2016-09-30 DIAGNOSIS — Z7982 Long term (current) use of aspirin: Secondary | ICD-10-CM | POA: Diagnosis not present

## 2016-09-30 DIAGNOSIS — F17210 Nicotine dependence, cigarettes, uncomplicated: Secondary | ICD-10-CM | POA: Diagnosis not present

## 2016-09-30 DIAGNOSIS — Z8781 Personal history of (healed) traumatic fracture: Secondary | ICD-10-CM | POA: Diagnosis not present

## 2016-09-30 DIAGNOSIS — Z471 Aftercare following joint replacement surgery: Secondary | ICD-10-CM | POA: Diagnosis not present

## 2016-09-30 DIAGNOSIS — Z96641 Presence of right artificial hip joint: Secondary | ICD-10-CM | POA: Diagnosis not present

## 2016-10-02 DIAGNOSIS — M1611 Unilateral primary osteoarthritis, right hip: Secondary | ICD-10-CM | POA: Diagnosis not present

## 2016-10-03 DIAGNOSIS — Z7982 Long term (current) use of aspirin: Secondary | ICD-10-CM | POA: Diagnosis not present

## 2016-10-03 DIAGNOSIS — Z96641 Presence of right artificial hip joint: Secondary | ICD-10-CM | POA: Diagnosis not present

## 2016-10-03 DIAGNOSIS — F17210 Nicotine dependence, cigarettes, uncomplicated: Secondary | ICD-10-CM | POA: Diagnosis not present

## 2016-10-03 DIAGNOSIS — Z471 Aftercare following joint replacement surgery: Secondary | ICD-10-CM | POA: Diagnosis not present

## 2016-10-03 DIAGNOSIS — Z79891 Long term (current) use of opiate analgesic: Secondary | ICD-10-CM | POA: Diagnosis not present

## 2016-10-03 DIAGNOSIS — Z8781 Personal history of (healed) traumatic fracture: Secondary | ICD-10-CM | POA: Diagnosis not present

## 2016-10-04 DIAGNOSIS — Z7982 Long term (current) use of aspirin: Secondary | ICD-10-CM | POA: Diagnosis not present

## 2016-10-04 DIAGNOSIS — Z8781 Personal history of (healed) traumatic fracture: Secondary | ICD-10-CM | POA: Diagnosis not present

## 2016-10-04 DIAGNOSIS — Z96641 Presence of right artificial hip joint: Secondary | ICD-10-CM | POA: Diagnosis not present

## 2016-10-04 DIAGNOSIS — Z79891 Long term (current) use of opiate analgesic: Secondary | ICD-10-CM | POA: Diagnosis not present

## 2016-10-04 DIAGNOSIS — F17210 Nicotine dependence, cigarettes, uncomplicated: Secondary | ICD-10-CM | POA: Diagnosis not present

## 2016-10-04 DIAGNOSIS — Z471 Aftercare following joint replacement surgery: Secondary | ICD-10-CM | POA: Diagnosis not present

## 2016-10-30 DIAGNOSIS — M1611 Unilateral primary osteoarthritis, right hip: Secondary | ICD-10-CM | POA: Diagnosis not present

## 2016-11-05 ENCOUNTER — Other Ambulatory Visit: Payer: Self-pay | Admitting: Obstetrics and Gynecology

## 2016-11-05 ENCOUNTER — Encounter: Payer: Self-pay | Admitting: Obstetrics and Gynecology

## 2016-11-05 ENCOUNTER — Ambulatory Visit (INDEPENDENT_AMBULATORY_CARE_PROVIDER_SITE_OTHER): Payer: BLUE CROSS/BLUE SHIELD | Admitting: Obstetrics and Gynecology

## 2016-11-05 VITALS — BP 124/78 | Ht 65.0 in | Wt 176.0 lb

## 2016-11-05 DIAGNOSIS — Z1211 Encounter for screening for malignant neoplasm of colon: Secondary | ICD-10-CM | POA: Diagnosis not present

## 2016-11-05 DIAGNOSIS — N951 Menopausal and female climacteric states: Secondary | ICD-10-CM | POA: Diagnosis not present

## 2016-11-05 DIAGNOSIS — Z01419 Encounter for gynecological examination (general) (routine) without abnormal findings: Secondary | ICD-10-CM | POA: Diagnosis not present

## 2016-11-05 DIAGNOSIS — Z124 Encounter for screening for malignant neoplasm of cervix: Secondary | ICD-10-CM | POA: Diagnosis not present

## 2016-11-05 DIAGNOSIS — Z1151 Encounter for screening for human papillomavirus (HPV): Secondary | ICD-10-CM

## 2016-11-05 DIAGNOSIS — Z8041 Family history of malignant neoplasm of ovary: Secondary | ICD-10-CM

## 2016-11-05 DIAGNOSIS — Z1231 Encounter for screening mammogram for malignant neoplasm of breast: Secondary | ICD-10-CM

## 2016-11-05 DIAGNOSIS — Z1239 Encounter for other screening for malignant neoplasm of breast: Secondary | ICD-10-CM

## 2016-11-05 LAB — HEMOCCULT GUIAC POC 1CARD (OFFICE): FECAL OCCULT BLD: NEGATIVE

## 2016-11-05 MED ORDER — ESTRADIOL 0.1 MG/GM VA CREA: 1.0000 | TOPICAL_CREAM | Freq: Every day | VAGINAL | 1 refills | Status: DC

## 2016-11-05 NOTE — Progress Notes (Addendum)
Chief Complaint  Patient presents with  . Annual Exam    HPI:      Ms. Erin Donovan is a 61 y.o. G2P1011 who LMP was No LMP recorded. Patient is postmenopausal., presents today for her annual examination.  Her menses are absent due to menopause. She does not have intermenstrual bleeding.  She does not have vasomotor sx.  Sex activity: single partner, contraception - post menopausal status. She does have vaginal dryness. She uses lubricants without much relief.   Last Pap: December 09, 2013  Results were: no abnormalities /neg HPV DNA.    Last mammogram: December 09, 2013  Results were: normal--routine follow-up in 12 months There is no FH of breast cancer. There is a FH of either cervical or ovarian cancer in her sister. Her sister never had chemo/rad, so makes me question cx. The patient does do self-breast exams.  Colonoscopy: to be sched this yr through PCP   Tobacco use: The patient currently smokes 1/2-1 packs of cigarettes per day for the past many years. Alcohol use: none Exercise: minimally active currently due to recent hip replacement  She does get adequate calcium and Vitamin D in her diet. She has her labs managed with her PCP.  Past Medical History:  Diagnosis Date  . Arthritis, rheumatoid ([place]) 11/2013  . Basal cell carcinoma of skin of face    "right side"  . Chronic lumbar pain    "broke L1 last year" (09/17/2016)  . Depression    single episode  . Family history of ovarian cancer 01/03/2014   genetic letter sent  . History of mammogram 07/30/2010   neg/cat 2  . Osteoarthritis    "hips" (09/17/2016)  . Osteoporosis 2017   c compression fracture of spine  . Osteosclerosis   . Right hip pain   . Weight gain     Past Surgical History:  Procedure Laterality Date  . BASAL CELL CARCINOMA EXCISION  1990s   "right side of my face"  . JOINT REPLACEMENT    . TOTAL HIP ARTHROPLASTY Right 09/17/2016  . TOTAL HIP ARTHROPLASTY Right 09/17/2016   Procedure: RIGHT  TOTAL HIP ARTHROPLASTY ANTERIOR APPROACH;  Surgeon: Renette Butters, MD;  Location: [facility];  Service: Orthopedics;  Laterality: Right;    Family History  Problem Relation Age of Onset  . Diabetes Other   . Hypertension Other   . Osteoporosis Other   . Ovarian cancer Sister 60  . Stroke Sister 83    Social History   Social History  . Marital status: Married    Spouse name: N/A  . Number of children: N/A  . Years of education: N/A   Occupational History  . Not on file.   Social History Main Topics  . Smoking status: Current Every Day Smoker    Packs/day: 1.00    Years: 46.00    Types: Cigarettes  . Smokeless tobacco: Never Used  . Alcohol use No  . Drug use: No  . Sexual activity: Yes    Birth control/ protection: Post-menopausal   Other Topics Concern  . Not on file   Social History Narrative  . No narrative on file     Current Outpatient Prescriptions:  .  Calcium-Vitamin D-Vitamin K (VIACTIV) 196-222-97 MG-UNT-MCG CHEW, Chew 2 tablets by mouth daily., Disp: , Rfl:  .  cetirizine (ZYRTEC) 10 MG tablet, Take 10 mg by mouth as needed for allergies., Disp: , Rfl:  .  Cholecalciferol (VITAMIN D3) 5000 units CAPS,  Take 1 capsule (5,000 Units total) by mouth daily., Disp: 30 capsule, Rfl:  .  fluticasone (FLONASE) 50 MCG/ACT nasal spray, Place 2 sprays into both nostrils daily., Disp: 16 g, Rfl: 3 .  magnesium oxide (MAG-OX) 400 MG tablet, Take 1 tablet (400 mg total) by mouth daily., Disp: , Rfl:  .  meloxicam (MOBIC) 15 MG tablet, Take 15 mg by mouth daily., Disp: , Rfl:  .  methocarbamol (ROBAXIN) 500 MG tablet, Take 1 tablet (500 mg total) by mouth every 6 (six) hours as needed for muscle spasms., Disp: 40 tablet, Rfl: 0 .  OVER THE COUNTER MEDICATION, Take 2 tablets by mouth daily. Juice Plus Fruit Blend, Disp: , Rfl:  .  OVER THE COUNTER MEDICATION, Take 2 tablets by mouth daily. Juice Plus Vegetable blend, Disp: , Rfl:  .  OVER THE COUNTER MEDICATION, Take 2  tablets by mouth daily. Juice Plus Vineyard blend, Disp: , Rfl:  .  Teriparatide, Recombinant, (FORTEO [place]), Inject 20 mg into the skin daily. On hold for the procedure, Disp: , Rfl:  .  aspirin EC 325 MG tablet, Take 1 tablet (325 mg total) by mouth daily. For 30 days post op for DVT Prophylaxis (Patient not taking: Reported on 11/05/2016), Disp: 30 tablet, Rfl: 0 .  Biotin 10000 MCG TABS, Take 1 tablet by mouth daily. (Patient not taking: Reported on 11/05/2016), Disp: 30 tablet, Rfl:  .  diazepam (VALIUM) 5 MG tablet, Take 5 mg by mouth daily as needed for anxiety., Disp: , Rfl:  .  docusate sodium (COLACE) 100 MG capsule, Take 1 capsule (100 mg total) by mouth 2 (two) times daily. To prevent constipation while taking pain medication. (Patient not taking: Reported on 11/05/2016), Disp: 60 capsule, Rfl: 0 .  estradiol (ESTRACE) 0.1 MG/GM vaginal cream, Place 1 Applicatorful vaginally at bedtime. Insert 1g nightly for 1 wk, then 1 g once weekly as maintenace, Disp: 42.5 g, Rfl: 1 .  HYDROcodone-acetaminophen (NORCO/VICODIN) 5-325 MG tablet, Take 1 tablet by mouth daily as needed for moderate pain., Disp: , Rfl:  .  omeprazole (PRILOSEC) 20 MG capsule, Take 1 capsule (20 mg total) by mouth daily. While taking anti inflammatory medicine daily (Patient not taking: Reported on 11/05/2016), Disp: 30 capsule, Rfl: 2 .  ondansetron (ZOFRAN) 4 MG tablet, Take 1 tablet (4 mg total) by mouth every 8 (eight) hours as needed for nausea or vomiting. (Patient not taking: Reported on 11/05/2016), Disp: 40 tablet, Rfl: 0 .  oxyCODONE-acetaminophen (ROXICET) 5-325 MG tablet, Take 1-2 tablets by mouth every 4 (four) hours as needed for severe pain. (Patient not taking: Reported on 11/05/2016), Disp: 60 tablet, Rfl: 0   ROS:  Review of Systems  Constitutional: Negative for fever, malaise/fatigue and weight loss.  HENT: Negative for congestion, ear pain and sinus pain.   Respiratory: Negative for cough, shortness of breath and  wheezing.   Cardiovascular: Negative for chest pain, orthopnea and leg swelling.  Gastrointestinal: Negative for constipation, diarrhea, nausea and vomiting.  Genitourinary: Negative for dysuria, frequency, hematuria and urgency.       Breast ROS: negative   Musculoskeletal: Positive for joint pain and myalgias. Negative for back pain.  Skin: Negative for itching and rash.  Neurological: Negative for dizziness, tingling, focal weakness and headaches.  Endo/Heme/Allergies: Negative for environmental allergies. Does not bruise/bleed easily.  Psychiatric/Behavioral: Negative for depression and suicidal ideas. The patient is not nervous/anxious and does not have insomnia.     Objective: BP 124/78   Ht  5\' 5"  (1.651 m)   Wt 176 lb (79.8 kg)   BMI 29.29 kg/m    Physical Exam  Constitutional: She is oriented to person, place, and time. She appears well-developed and well-nourished.  Genitourinary: Rectum normal, vagina normal and uterus normal. There is no rash or tenderness on the right labia. There is no rash or tenderness on the left labia. No erythema or tenderness in the vagina. No vaginal discharge found. Right adnexum does not display mass and does not display tenderness. Left adnexum does not display mass and does not display tenderness. Cervix does not exhibit motion tenderness or polyp. Uterus is not enlarged or tender. Rectal exam shows no fissure, no mass and guaiac negative stool.  Neck: Normal range of motion. No thyromegaly present.  Cardiovascular: Normal rate, regular rhythm and normal heart sounds.   No murmur heard. Pulmonary/Chest: Effort normal and breath sounds normal. Right breast exhibits no mass, no nipple discharge, no skin change and no tenderness. Left breast exhibits no mass, no nipple discharge, no skin change and no tenderness.  Abdominal: Soft. There is no tenderness. There is no guarding.  Musculoskeletal: Normal range of motion.  Neurological: She is alert and  oriented to person, place, and time. No cranial nerve deficit.  Psychiatric: She has a normal mood and affect. Her behavior is normal.  Vitals reviewed.   Results: Results for orders placed or performed in visit on 11/05/16 (from the past 24 hour(s))  POCT Occult Blood Stool     Status: Normal   Collection Time: 11/05/16 10:57 AM  Result Value Ref Range   Fecal Occult Blood, POC Negative Negative   Card #1 Date     Card #2 Fecal Occult Blod, POC     Card #2 Date     Card #3 Fecal Occult Blood, POC     Card #3 Date       Assessment/Plan:  Encounter for annual routine gynecological examination  Cervical cancer screening - Plan: IGP, Aptima HPV  Screening for HPV (human papillomavirus) - Plan: IGP, Aptima HPV  Screening for breast cancer - Pt to sched mammo. - Plan: MM DIGITAL SCREENING BILATERAL  Screening for colon cancer - Neg FOBT. Pt to have colonoscopy through PCP. - Plan: POCT Occult Blood Stool  Vaginal dryness, menopausal - Try vag ERT. Rx estrace crm/coupon card. Use lubricants. - Plan: estradiol (ESTRACE) 0.1 MG/GM vaginal cream  Family history of ovarian cancer - Pt to clarify FH wtih sister. May be cx. If ovar, pt qualifies for cancer genetic testing, discussed today. F/u prn.          GYN counsel mammography screening, adequate intake of calcium and vitamin D     F/U  Return in about 1 year (around 11/05/2017).  Alicia B. Copland, PA-C 11/05/2016 11:11 AM

## 2016-11-08 LAB — IGP, APTIMA HPV
HPV APTIMA: NEGATIVE
PAP Smear Comment: 0

## 2016-11-19 ENCOUNTER — Other Ambulatory Visit: Payer: Self-pay

## 2016-11-19 ENCOUNTER — Telehealth: Payer: Self-pay

## 2016-11-19 DIAGNOSIS — Z1211 Encounter for screening for malignant neoplasm of colon: Secondary | ICD-10-CM

## 2016-11-19 NOTE — Telephone Encounter (Signed)
Gastroenterology Pre-Procedure Review  Request Date: 12/05/16 Requesting Physician: Dr. Allen Norris  PATIENT REVIEW QUESTIONS: The patient responded to the following health history questions as indicated:    1. Are you having any GI issues? no 2. Do you have a personal history of Polyps? yes (self) 3. Do you have a family history of Colon Cancer or Polyps? no 4. Diabetes Mellitus? no 5. Joint replacements in the past 12 months?yes (Hip Replacement 09/17/16 Dr. [facility])) 6. Major health problems in the past 3 months?no 7. Any artificial heart valves, MVP, or defibrillator?no    MEDICATIONS & ALLERGIES:    Patient reports the following regarding taking any anticoagulation/antiplatelet therapy:   Plavix, Coumadin, Eliquis, Xarelto, Lovenox, Pradaxa, Brilinta, or Effient? no Aspirin? no  Patient confirms/reports the following medications:  Current Outpatient Prescriptions  Medication Sig Dispense Refill  . aspirin EC 325 MG tablet Take 1 tablet (325 mg total) by mouth daily. For 30 days post op for DVT Prophylaxis (Patient not taking: Reported on 11/05/2016) 30 tablet 0  . Biotin 10000 MCG TABS Take 1 tablet by mouth daily. (Patient not taking: Reported on 11/05/2016) 30 tablet   . Calcium-Vitamin D-Vitamin K (VIACTIV) 409-811-91 MG-UNT-MCG CHEW Chew 2 tablets by mouth daily.    . cetirizine (ZYRTEC) 10 MG tablet Take 10 mg by mouth as needed for allergies.    . Cholecalciferol (VITAMIN D3) 5000 units CAPS Take 1 capsule (5,000 Units total) by mouth daily. 30 capsule   . diazepam (VALIUM) 5 MG tablet Take 5 mg by mouth daily as needed for anxiety.    . docusate sodium (COLACE) 100 MG capsule Take 1 capsule (100 mg total) by mouth 2 (two) times daily. To prevent constipation while taking pain medication. (Patient not taking: Reported on 11/05/2016) 60 capsule 0  . estradiol (ESTRACE) 0.1 MG/GM vaginal cream Place 1 Applicatorful vaginally at bedtime. Insert 1g nightly for 1 wk, then 1 g  once weekly as maintenace 42.5 g 1  . fluticasone (FLONASE) 50 MCG/ACT nasal spray Place 2 sprays into both nostrils daily. 16 g 3  . HYDROcodone-acetaminophen (NORCO/VICODIN) 5-325 MG tablet Take 1 tablet by mouth daily as needed for moderate pain.    . magnesium oxide (MAG-OX) 400 MG tablet Take 1 tablet (400 mg total) by mouth daily.    . meloxicam (MOBIC) 15 MG tablet Take 15 mg by mouth daily.    . methocarbamol (ROBAXIN) 500 MG tablet Take 1 tablet (500 mg total) by mouth every 6 (six) hours as needed for muscle spasms. 40 tablet 0  . omeprazole (PRILOSEC) 20 MG capsule Take 1 capsule (20 mg total) by mouth daily. While taking anti inflammatory medicine daily (Patient not taking: Reported on 11/05/2016) 30 capsule 2  . ondansetron (ZOFRAN) 4 MG tablet Take 1 tablet (4 mg total) by mouth every 8 (eight) hours as needed for nausea or vomiting. (Patient not taking: Reported on 11/05/2016) 40 tablet 0  . OVER THE COUNTER MEDICATION Take 2 tablets by mouth daily. Juice Plus Fruit Blend    . OVER THE COUNTER MEDICATION Take 2 tablets by mouth daily. Juice Plus Vegetable blend    . OVER THE COUNTER MEDICATION Take 2 tablets by mouth daily. Juice Plus Vineyard blend    . oxyCODONE-acetaminophen (ROXICET) 5-325 MG tablet Take 1-2 tablets by mouth every 4 (four) hours as needed for severe pain. (Patient not taking: Reported on 11/05/2016) 60 tablet 0  . Teriparatide, Recombinant, (FORTEO [place]) Inject 20 mg into the skin daily.  On hold for the procedure     No current facility-administered medications for this visit.     Patient confirms/reports the following allergies:  Allergies  Allergen Reactions  . No Known Allergies     No orders of the defined types were placed in this encounter.   AUTHORIZATION INFORMATION Primary Insurance: 1D#: Group #:  Secondary Insurance: 1D#: Group #:  SCHEDULE INFORMATION: Date: 12/05/16 Time: Location:Dr. Trimble

## 2016-11-28 ENCOUNTER — Encounter: Payer: Self-pay | Admitting: *Deleted

## 2016-12-04 NOTE — Discharge Instructions (Signed)
General Anesthesia, Adult, Care After °These instructions provide you with information about caring for yourself after your procedure. Your health care provider may also give you more specific instructions. Your treatment has been planned according to current medical practices, but problems sometimes occur. Call your health care provider if you have any problems or questions after your procedure. °What can I expect after the procedure? °After the procedure, it is common to have: °· Vomiting. °· A sore throat. °· Mental slowness. ° °It is common to feel: °· Nauseous. °· Cold or shivery. °· Sleepy. °· Tired. °· Sore or achy, even in parts of your body where you did not have surgery. ° °Follow these instructions at home: °For at least 24 hours after the procedure: °· Do not: °? Participate in activities where you could fall or become injured. °? Drive. °? Use heavy machinery. °? Drink alcohol. °? Take sleeping pills or medicines that cause drowsiness. °? Make important decisions or sign legal documents. °? Take care of children on your own. °· Rest. °Eating and drinking °· If you vomit, drink water, juice, or soup when you can drink without vomiting. °· Drink enough fluid to keep your urine clear or pale yellow. °· Make sure you have little or no nausea before eating solid foods. °· Follow the diet recommended by your health care provider. °General instructions °· Have a responsible adult stay with you until you are awake and alert. °· Return to your normal activities as told by your health care provider. Ask your health care provider what activities are safe for you. °· Take over-the-counter and prescription medicines only as told by your health care provider. °· If you smoke, do not smoke without supervision. °· Keep all follow-up visits as told by your health care provider. This is important. °Contact a health care provider if: °· You continue to have nausea or vomiting at home, and medicines are not helpful. °· You  cannot drink fluids or start eating again. °· You cannot urinate after 8-12 hours. °· You develop a skin rash. °· You have fever. °· You have increasing redness at the site of your procedure. °Get help right away if: °· You have difficulty breathing. °· You have chest pain. °· You have unexpected bleeding. °· You feel that you are having a life-threatening or urgent problem. °This information is not intended to replace advice given to you by your health care provider. Make sure you discuss any questions you have with your health care provider. °Document Released: 09/23/2000 Document Revised: 11/20/2015 Document Reviewed: 06/01/2015 °Elsevier Interactive Patient Education © 2018 Elsevier Inc. ° °

## 2016-12-05 ENCOUNTER — Ambulatory Visit
Admission: RE | Admit: 2016-12-05 | Discharge: 2016-12-05 | Disposition: A | Discharge status: Home / Self Care | Payer: BLUE CROSS/BLUE SHIELD | Source: Ambulatory Visit | Attending: Gastroenterology | Admitting: Gastroenterology

## 2016-12-05 ENCOUNTER — Encounter
Admission: RE | Disposition: A | Discharge status: Home / Self Care | Payer: Self-pay | Source: Ambulatory Visit | Attending: Gastroenterology

## 2016-12-05 ENCOUNTER — Ambulatory Visit: Payer: BLUE CROSS/BLUE SHIELD | Admitting: Anesthesiology

## 2016-12-05 DIAGNOSIS — Z833 Family history of diabetes mellitus: Secondary | ICD-10-CM | POA: Diagnosis not present

## 2016-12-05 DIAGNOSIS — Z8249 Family history of ischemic heart disease and other diseases of the circulatory system: Secondary | ICD-10-CM | POA: Diagnosis not present

## 2016-12-05 DIAGNOSIS — Z85828 Personal history of other malignant neoplasm of skin: Secondary | ICD-10-CM | POA: Diagnosis not present

## 2016-12-05 DIAGNOSIS — D125 Benign neoplasm of sigmoid colon: Secondary | ICD-10-CM | POA: Insufficient documentation

## 2016-12-05 DIAGNOSIS — Z79899 Other long term (current) drug therapy: Secondary | ICD-10-CM | POA: Insufficient documentation

## 2016-12-05 DIAGNOSIS — K641 Second degree hemorrhoids: Secondary | ICD-10-CM | POA: Insufficient documentation

## 2016-12-05 DIAGNOSIS — M069 Rheumatoid arthritis, unspecified: Secondary | ICD-10-CM | POA: Insufficient documentation

## 2016-12-05 DIAGNOSIS — Z1211 Encounter for screening for malignant neoplasm of colon: Secondary | ICD-10-CM

## 2016-12-05 DIAGNOSIS — Z96641 Presence of right artificial hip joint: Secondary | ICD-10-CM | POA: Diagnosis not present

## 2016-12-05 DIAGNOSIS — M810 Age-related osteoporosis without current pathological fracture: Secondary | ICD-10-CM | POA: Diagnosis not present

## 2016-12-05 DIAGNOSIS — G8929 Other chronic pain: Secondary | ICD-10-CM | POA: Diagnosis not present

## 2016-12-05 DIAGNOSIS — Z7982 Long term (current) use of aspirin: Secondary | ICD-10-CM | POA: Diagnosis not present

## 2016-12-05 DIAGNOSIS — Z8041 Family history of malignant neoplasm of ovary: Secondary | ICD-10-CM | POA: Diagnosis not present

## 2016-12-05 DIAGNOSIS — Z823 Family history of stroke: Secondary | ICD-10-CM | POA: Diagnosis not present

## 2016-12-05 DIAGNOSIS — Q782 Osteopetrosis: Secondary | ICD-10-CM | POA: Diagnosis not present

## 2016-12-05 DIAGNOSIS — M545 Low back pain: Secondary | ICD-10-CM | POA: Diagnosis not present

## 2016-12-05 DIAGNOSIS — F17210 Nicotine dependence, cigarettes, uncomplicated: Secondary | ICD-10-CM | POA: Insufficient documentation

## 2016-12-05 DIAGNOSIS — F329 Major depressive disorder, single episode, unspecified: Secondary | ICD-10-CM | POA: Insufficient documentation

## 2016-12-05 DIAGNOSIS — K635 Polyp of colon: Secondary | ICD-10-CM | POA: Diagnosis not present

## 2016-12-05 HISTORY — DX: Dental restoration status: Z98.811

## 2016-12-05 HISTORY — PX: POLYPECTOMY: SHX5525

## 2016-12-05 HISTORY — PX: COLONOSCOPY WITH PROPOFOL: SHX5780

## 2016-12-05 SURGERY — COLONOSCOPY WITH PROPOFOL
Anesthesia: General | Site: Rectum | Wound class: Dirty or Infected

## 2016-12-05 MED ORDER — STERILE WATER FOR IRRIGATION IR SOLN
Status: DC | PRN
  Administered 2016-12-05: 11:00:00

## 2016-12-05 MED ORDER — LIDOCAINE HCL (CARDIAC) 20 MG/ML IV SOLN
INTRAVENOUS | Status: DC | PRN
  Administered 2016-12-05: 50 mg via INTRAVENOUS

## 2016-12-05 MED ORDER — LACTATED RINGERS IV SOLN
INTRAVENOUS | Status: DC
  Administered 2016-12-05: 10:00:00 via INTRAVENOUS

## 2016-12-05 MED ORDER — PROPOFOL 10 MG/ML IV BOLUS
INTRAVENOUS | Status: DC | PRN
  Administered 2016-12-05 (×5): 50 mg via INTRAVENOUS
  Administered 2016-12-05: 100 mg via INTRAVENOUS

## 2016-12-05 SURGICAL SUPPLY — 23 items
CANISTER SUCT 1200ML W/VALVE (MISCELLANEOUS) ×2 IMPLANT
CLIP HMST 235XBRD CATH ROT (MISCELLANEOUS) IMPLANT
CLIP RESOLUTION 360 11X235 (MISCELLANEOUS)
FCP ESCP3.2XJMB 240X2.8X (MISCELLANEOUS)
FORCEPS BIOP RAD 4 LRG CAP 4 (CUTTING FORCEPS) ×2 IMPLANT
FORCEPS BIOP RJ4 240 W/NDL (MISCELLANEOUS)
FORCEPS ESCP3.2XJMB 240X2.8X (MISCELLANEOUS) IMPLANT
GOWN CVR UNV OPN BCK APRN NK (MISCELLANEOUS) ×2 IMPLANT
GOWN ISOL THUMB LOOP REG UNIV (MISCELLANEOUS) ×2
INJECTOR VARIJECT VIN23 (MISCELLANEOUS) IMPLANT
KIT DEFENDO VALVE AND CONN (KITS) IMPLANT
KIT ENDO PROCEDURE OLY (KITS) ×2 IMPLANT
MARKER SPOT ENDO TATTOO 5ML (MISCELLANEOUS) IMPLANT
PAD GROUND ADULT SPLIT (MISCELLANEOUS) IMPLANT
PROBE APC STR FIRE (PROBE) IMPLANT
RETRIEVER NET ROTH 2.5X230 LF (MISCELLANEOUS) IMPLANT
SNARE SHORT THROW 13M SML OVAL (MISCELLANEOUS) ×2 IMPLANT
SNARE SHORT THROW 30M LRG OVAL (MISCELLANEOUS) IMPLANT
SNARE SNG USE RND 15MM (INSTRUMENTS) IMPLANT
SPOT EX ENDOSCOPIC TATTOO (MISCELLANEOUS)
TRAP ETRAP POLY (MISCELLANEOUS) IMPLANT
VARIJECT INJECTOR VIN23 (MISCELLANEOUS)
WATER STERILE IRR 250ML POUR (IV SOLUTION) ×2 IMPLANT

## 2016-12-05 NOTE — Op Note (Signed)
[facility] Patient Name: Erin Donovan Procedure Date: 12/05/2016 10:28 AM MRN: [id number] Account #: [id number] Date of Birth: August 25, 1955 Admit Type: Outpatient Age: 61 Room: [facility] OR ROOM 01 Gender: Female Note Status: Finalized Procedure:            Colonoscopy Indications:          Screening for colorectal malignant neoplasm Providers:            Lucilla Lame MD, MD Referring MD:         Satira Anis. Plonk, MD (Referring MD) Medicines:            Propofol per Anesthesia Complications:        No immediate complications. Procedure:            Pre-Anesthesia Assessment:                       - Prior to the procedure, a History and Physical was                        performed, and patient medications and allergies were                        reviewed. The patient's tolerance of previous                        anesthesia was also reviewed. The risks and benefits of                        the procedure and the sedation options and risks were                        discussed with the patient. All questions were                        answered, and informed consent was obtained. Prior                        Anticoagulants: The patient has taken no previous                        anticoagulant or antiplatelet agents. ASA Grade                        Assessment: II - A patient with mild systemic disease.                        After reviewing the risks and benefits, the patient was                        deemed in satisfactory condition to undergo the                        procedure.                       After obtaining informed consent, the colonoscope was                        passed under direct vision. Throughout the procedure,  the patient's blood pressure, pulse, and oxygen                        saturations were monitored continuously. The Olympus                        PCF H180AL Colonoscope (S#: [id number]) was introduced                       through the anus and advanced to the the cecum,                        identified by appendiceal orifice and ileocecal valve.                        The colonoscopy was performed without difficulty. The                        patient tolerated the procedure well. The quality of                        the bowel preparation was excellent. Findings:      The perianal and digital rectal examinations were normal.      A 4 mm polyp was found in the sigmoid colon. The polyp was sessile. The       polyp was removed with a cold biopsy forceps. Resection and retrieval       were complete.      A 7 mm polyp was found in the sigmoid colon. The polyp was sessile. The       polyp was removed with a cold snare. Resection and retrieval were       complete.      Non-bleeding internal hemorrhoids were found during retroflexion. The       hemorrhoids were Grade II (internal hemorrhoids that prolapse but reduce       spontaneously). Impression:           - One 4 mm polyp in the sigmoid colon, removed with a                        cold biopsy forceps. Resected and retrieved.                       - One 7 mm polyp in the sigmoid colon, removed with a                        cold snare. Resected and retrieved.                       - Non-bleeding internal hemorrhoids. Recommendation:       - Discharge patient to home.                       - Resume previous diet.                       - Continue present medications.                       - Repeat colonoscopy in 5 years if polyp adenoma and 10  years if hyperplastic Procedure Code(s):    --- Professional ---                       [phone], Colonoscopy, flexible; with removal of tumor(s),                        polyp(s), or other lesion(s) by snare technique                       [id number], 59, Colonoscopy, flexible; with biopsy, single                        or multiple Diagnosis Code(s):    --- Professional ---                        Z12.11, Encounter for screening for malignant neoplasm                        of colon                       D12.5, Benign neoplasm of sigmoid colon CPT copyright 2016 American Medical Association. All rights reserved. The codes documented in this report are preliminary and upon coder review may  be revised to meet current compliance requirements. Lucilla Lame MD, MD 12/05/2016 10:51:45 AM This report has been signed electronically. Number of Addenda: 0 Note Initiated On: 12/05/2016 10:28 AM Scope Withdrawal Time: 0 hours 7 minutes 59 seconds  Total Procedure Duration: 0 hours 12 minutes 10 seconds       [facility]

## 2016-12-05 NOTE — Transfer of Care (Signed)
Immediate Anesthesia Transfer of Care Note  Patient: Erin Donovan  Procedure(s) Performed: Procedure(s): COLONOSCOPY WITH PROPOFOL (N/A) POLYPECTOMY (N/A)  Patient Location: PACU  Anesthesia Type: General  Level of Consciousness: awake, alert  and patient cooperative  Airway and Oxygen Therapy: Patient Spontanous Breathing and Patient connected to supplemental oxygen  Post-op Assessment: Post-op Vital signs reviewed, Patient's Cardiovascular Status Stable, Respiratory Function Stable, Patent Airway and No signs of Nausea or vomiting  Post-op Vital Signs: Reviewed and stable  Complications: No apparent anesthesia complications

## 2016-12-05 NOTE — Anesthesia Preprocedure Evaluation (Signed)
Anesthesia Evaluation  Patient identified by MRN, date of birth, ID band Patient awake    Reviewed: Allergy & Precautions, H&P , NPO status , Patient's Chart, lab work & pertinent test results  Airway Mallampati: II  TM Distance: >3 FB Neck ROM: full    Dental no notable dental hx.    Pulmonary Current Smoker,    Pulmonary exam normal        Cardiovascular Normal cardiovascular exam     Neuro/Psych PSYCHIATRIC DISORDERS    GI/Hepatic   Endo/Other    Renal/GU      Musculoskeletal   Abdominal   Peds  Hematology   Anesthesia Other Findings   Reproductive/Obstetrics                             Anesthesia Physical Anesthesia Plan  ASA: II  Anesthesia Plan: General   Post-op Pain Management:    Induction:   PONV Risk Score and Plan: 1 and Propofol  Airway Management Planned:   Additional Equipment:   Intra-op Plan:   Post-operative Plan:   Informed Consent: I have reviewed the patients History and Physical, chart, labs and discussed the procedure including the risks, benefits and alternatives for the proposed anesthesia with the patient or authorized representative who has indicated his/her understanding and acceptance.     Plan Discussed with:   Anesthesia Plan Comments:         Anesthesia Quick Evaluation

## 2016-12-05 NOTE — H&P (Signed)
Erin Lame, MD [address] [place], [place] [address] Phone: [phone] Fax : [phone]  Primary Care Physician:  Adline Potter, MD Primary Gastroenterologist:  Dr. Allen Norris  Pre-Procedure History & Physical: HPI:  Erin Donovan is a 61 y.o. female is here for a screening colonoscopy.   Past Medical History:  Diagnosis Date  . Arthritis, rheumatoid ([place]) 11/2013  . Basal cell carcinoma of skin of face    "right side"  . Chronic lumbar pain    "broke L1 last year" (09/17/2016)  . Dental crowns present    2 implants - right upper - tooth 4 & 5  . Depression    single episode  . Family history of ovarian cancer 01/03/2014   genetic letter sent  . History of mammogram 07/30/2010   neg/cat 2  . Osteoarthritis    "hips" (09/17/2016)  . Osteoporosis 2017   c compression fracture of spine  . Osteosclerosis   . Right hip pain   . Weight gain     Past Surgical History:  Procedure Laterality Date  . BASAL CELL CARCINOMA EXCISION  1990s   "right side of my face"  . JOINT REPLACEMENT    . TOTAL HIP ARTHROPLASTY Right 09/17/2016  . TOTAL HIP ARTHROPLASTY Right 09/17/2016   Procedure: RIGHT TOTAL HIP ARTHROPLASTY ANTERIOR APPROACH;  Surgeon: Renette Butters, MD;  Location: [facility];  Service: Orthopedics;  Laterality: Right;    Prior to Admission medications   Medication Sig Start Date End Date Taking? Authorizing Provider  Calcium-Vitamin D-Vitamin K (VIACTIV) [phone] MG-UNT-MCG CHEW Chew 2 tablets by mouth daily.   Yes [provider]  cetirizine (ZYRTEC) 10 MG tablet Take 10 mg by mouth as needed for allergies.   Yes [provider]  Cholecalciferol (VITAMIN D3) 5000 units CAPS Take 1 capsule (5,000 Units total) by mouth daily. 07/31/16  Yes Plonk, Gwyndolyn Saxon, MD  fluticasone (FLONASE) 50 MCG/ACT nasal spray Place 2 sprays into both nostrils daily. 08/27/16  Yes Plonk, Gwyndolyn Saxon, MD  magnesium oxide (MAG-OX) 400 MG tablet Take 1 tablet (400 mg  total) by mouth daily. 07/31/16  Yes Plonk, Gwyndolyn Saxon, MD  meloxicam (MOBIC) 15 MG tablet Take 15 mg by mouth daily.   Yes [provider]  Nutritional Supplements (JUICE PLUS FIBRE PO) Take by mouth daily.   Yes [provider]  Teriparatide, Recombinant, (FORTEO [place]) Inject 20 mg into the skin daily. On hold for the procedure   Yes [provider]  aspirin EC 325 MG tablet Take 1 tablet (325 mg total) by mouth daily. For 30 days post op for DVT Prophylaxis Patient not taking: Reported on 11/05/2016 09/17/16   Prudencio Burly III, PA-C  diazepam (VALIUM) 5 MG tablet Take 5 mg by mouth daily as needed for anxiety.    [provider]  estradiol (ESTRACE) 0.1 MG/GM vaginal cream Place 1 Applicatorful vaginally at bedtime. Insert 1g nightly for 1 wk, then 1 g once weekly as maintenace Patient not taking: Reported on 11/28/2016 08/07/01   Copland, Deirdre Evener, PA-C  HYDROcodone-acetaminophen (NORCO/VICODIN) 5-325 MG tablet Take 1 tablet by mouth daily as needed for moderate pain.    [provider]  methocarbamol (ROBAXIN) 500 MG tablet Take 1 tablet (500 mg total) by mouth every 6 (six) hours as needed for muscle spasms. 09/17/16   Prudencio Burly III, PA-C  omeprazole (PRILOSEC) 20 MG capsule Take 1 capsule (20 mg total) by mouth daily. While taking anti inflammatory medicine  daily Patient not taking: Reported on 11/05/2016 09/17/16   Prudencio Burly III, PA-C  ondansetron (ZOFRAN) 4 MG tablet Take 1 tablet (4 mg total) by mouth every 8 (eight) hours as needed for nausea or vomiting. Patient not taking: Reported on 11/05/2016 09/17/16   Prudencio Burly III, PA-C  oxyCODONE-acetaminophen (ROXICET) 5-325 MG tablet Take 1-2 tablets by mouth every 4 (four) hours as needed for severe pain. Patient not taking: Reported on 11/05/2016 09/17/16   Prudencio Burly III, PA-C    Allergies as of 11/19/2016 - Review Complete 11/05/2016  Allergen  Reaction Noted  . No known allergies  09/16/2016    Family History  Problem Relation Age of Onset  . Diabetes Other   . Hypertension Other   . Osteoporosis Other   . Ovarian cancer Sister 56  . Stroke Sister 41    Social History   Social History  . Marital status: Married    Spouse name: N/A  . Number of children: N/A  . Years of education: N/A   Occupational History  . Not on file.   Social History Main Topics  . Smoking status: Current Every Day Smoker    Packs/day: 1.00    Years: 46.00    Types: Cigarettes  . Smokeless tobacco: Never Used     Comment: startedage 16  . Alcohol use No  . Drug use: No  . Sexual activity: Yes    Birth control/ protection: Post-menopausal   Other Topics Concern  . Not on file   Social History Narrative  . No narrative on file    Review of Systems: See HPI, otherwise negative ROS  Physical Exam: BP (!) 142/63   Pulse (!) 59   Temp 97.2 F (36.2 C) (Tympanic)   Resp 16   Ht 5\' 5"  (1.651 m)   Wt 172 lb (78 kg)   SpO2 99%   BMI 28.62 kg/m  General:   Alert,  pleasant and cooperative in NAD Head:  Normocephalic and atraumatic. Neck:  Supple; no masses or thyromegaly. Lungs:  Clear throughout to auscultation.    Heart:  Regular rate and rhythm. Abdomen:  Soft, nontender and nondistended. Normal bowel sounds, without guarding, and without rebound.   Neurologic:  Alert and  oriented x4;  grossly normal neurologically.  Impression/Plan: Erin Donovan is now here to undergo a screening colonoscopy.  Risks, benefits, and alternatives regarding colonoscopy have been reviewed with the patient.  Questions have been answered.  All parties agreeable.

## 2016-12-05 NOTE — Anesthesia Postprocedure Evaluation (Signed)
Anesthesia Post Note  Patient: Erin Donovan  Procedure(s) Performed: Procedure(s) (LRB): COLONOSCOPY WITH PROPOFOL (N/A) POLYPECTOMY (N/A)  Patient location during evaluation: PACU Anesthesia Type: General Level of consciousness: awake and alert and oriented Pain management: satisfactory to patient Vital Signs Assessment: post-procedure vital signs reviewed and stable Respiratory status: spontaneous breathing, nonlabored ventilation and respiratory function stable Cardiovascular status: blood pressure returned to baseline and stable Postop Assessment: Adequate PO intake and No signs of nausea or vomiting Anesthetic complications: no    Raliegh Ip

## 2016-12-05 NOTE — Anesthesia Procedure Notes (Addendum)
Performed by: Olis Viverette Pre-anesthesia Checklist: Patient identified, Emergency Drugs available, Suction available, Timeout performed and Patient being monitored Patient Re-evaluated:Patient Re-evaluated prior to induction Oxygen Delivery Method: Nasal cannula Placement Confirmation: positive ETCO2       

## 2016-12-06 ENCOUNTER — Encounter: Payer: Self-pay | Admitting: Gastroenterology

## 2016-12-08 ENCOUNTER — Encounter: Payer: Self-pay | Admitting: Gastroenterology

## 2016-12-17 ENCOUNTER — Encounter: Payer: Self-pay | Admitting: Obstetrics and Gynecology

## 2016-12-17 ENCOUNTER — Ambulatory Visit
Admission: RE | Admit: 2016-12-17 | Discharge: 2016-12-17 | Disposition: A | Discharge status: Home / Self Care | Payer: BLUE CROSS/BLUE SHIELD | Source: Ambulatory Visit | Attending: Obstetrics and Gynecology | Admitting: Obstetrics and Gynecology

## 2016-12-17 DIAGNOSIS — Z1231 Encounter for screening mammogram for malignant neoplasm of breast: Secondary | ICD-10-CM | POA: Insufficient documentation

## 2016-12-17 DIAGNOSIS — Z1239 Encounter for other screening for malignant neoplasm of breast: Secondary | ICD-10-CM

## 2017-02-24 ENCOUNTER — Ambulatory Visit: Payer: BLUE CROSS/BLUE SHIELD | Admitting: Family Medicine

## 2017-02-25 ENCOUNTER — Encounter: Payer: Self-pay | Admitting: Family Medicine

## 2017-02-25 ENCOUNTER — Ambulatory Visit (INDEPENDENT_AMBULATORY_CARE_PROVIDER_SITE_OTHER): Payer: BLUE CROSS/BLUE SHIELD | Admitting: Family Medicine

## 2017-02-25 VITALS — BP 124/84 | HR 84 | Resp 16 | Ht 65.0 in | Wt 172.7 lb

## 2017-02-25 DIAGNOSIS — F17200 Nicotine dependence, unspecified, uncomplicated: Secondary | ICD-10-CM | POA: Diagnosis not present

## 2017-02-25 DIAGNOSIS — M159 Polyosteoarthritis, unspecified: Secondary | ICD-10-CM

## 2017-02-25 DIAGNOSIS — M810 Age-related osteoporosis without current pathological fracture: Secondary | ICD-10-CM | POA: Diagnosis not present

## 2017-02-25 DIAGNOSIS — J302 Other seasonal allergic rhinitis: Secondary | ICD-10-CM | POA: Diagnosis not present

## 2017-02-25 DIAGNOSIS — M150 Primary generalized (osteo)arthritis: Secondary | ICD-10-CM

## 2017-02-25 DIAGNOSIS — E663 Overweight: Secondary | ICD-10-CM

## 2017-02-25 MED ORDER — OMEPRAZOLE 20 MG PO CPDR
20.0000 mg | DELAYED_RELEASE_CAPSULE | Freq: Every day | ORAL | 2 refills | Status: DC | PRN
Start: 2017-02-25 — End: 2018-12-28

## 2017-02-25 NOTE — Patient Instructions (Signed)
Steps to Quit Smoking Smoking tobacco can be harmful to your health and can affect almost every organ in your body. Smoking puts you, and those around you, at risk for developing many serious chronic diseases. Quitting smoking is difficult, but it is one of the best things that you can do for your health. It is never too late to quit. What are the benefits of quitting smoking? When you quit smoking, you lower your risk of developing serious diseases and conditions, such as:  Lung cancer or lung disease, such as COPD.  Heart disease.  Stroke.  Heart attack.  Infertility.  Osteoporosis and bone fractures.  Additionally, symptoms such as coughing, wheezing, and shortness of breath may get better when you quit. You may also find that you get sick less often because your body is stronger at fighting off colds and infections. If you are pregnant, quitting smoking can help to reduce your chances of having a baby of low birth weight. How do I get ready to quit? When you decide to quit smoking, create a plan to make sure that you are successful. Before you quit:  Pick a date to quit. Set a date within the next two weeks to give you time to prepare.  Write down the reasons why you are quitting. Keep this list in places where you will see it often, such as on your bathroom mirror or in your car or wallet.  Identify the people, places, things, and activities that make you want to smoke (triggers) and avoid them. Make sure to take these actions: ? Throw away all cigarettes at home, at work, and in your car. ? Throw away smoking accessories, such as ashtrays and lighters. ? Clean your car and make sure to empty the ashtray. ? Clean your home, including curtains and carpets.  Tell your family, friends, and coworkers that you are quitting. Support from your loved ones can make quitting easier.  Talk with your health care provider about your options for quitting smoking.  Find out what treatment  options are covered by your health insurance.  What strategies can I use to quit smoking? Talk with your healthcare provider about different strategies to quit smoking. Some strategies include:  Quitting smoking altogether instead of gradually lessening how much you smoke over a period of time. Research shows that quitting "cold turkey" is more successful than gradually quitting.  Attending in-person counseling to help you build problem-solving skills. You are more likely to have success in quitting if you attend several counseling sessions. Even short sessions of 10 minutes can be effective.  Finding resources and support systems that can help you to quit smoking and remain smoke-free after you quit. These resources are most helpful when you use them often. They can include: ? Online chats with a counselor. ? Telephone quitlines. ? Printed self-help materials. ? Support groups or group counseling. ? Text messaging programs. ? Mobile phone applications.  Taking medicines to help you quit smoking. (If you are pregnant or breastfeeding, talk with your health care provider first.) Some medicines contain nicotine and some do not. Both types of medicines help with cravings, but the medicines that include nicotine help to relieve withdrawal symptoms. Your health care provider may recommend: ? Nicotine patches, gum, or lozenges. ? Nicotine inhalers or sprays. ? Non-nicotine medicine that is taken by mouth.  Talk with your health care provider about combining strategies, such as taking medicines while you are also receiving in-person counseling. Using these two strategies together   makes you more likely to succeed in quitting than if you used either strategy on its own. If you are pregnant or breastfeeding, talk with your health care provider about finding counseling or other support strategies to quit smoking. Do not take medicine to help you quit smoking unless told to do so by your health care  provider. What things can I do to make it easier to quit? Quitting smoking might feel overwhelming at first, but there is a lot that you can do to make it easier. Take these important actions:  Reach out to your family and friends and ask that they support and encourage you during this time. Call telephone quitlines, reach out to support groups, or work with a counselor for support.  Ask people who smoke to avoid smoking around you.  Avoid places that trigger you to smoke, such as bars, parties, or smoke-break areas at work.  Spend time around people who do not smoke.  Lessen stress in your life, because stress can be a smoking trigger for some people. To lessen stress, try: ? Exercising regularly. ? Deep-breathing exercises. ? Yoga. ? Meditating. ? Performing a body scan. This involves closing your eyes, scanning your body from head to toe, and noticing which parts of your body are particularly tense. Purposefully relax the muscles in those areas.  Download or purchase mobile phone or tablet apps (applications) that can help you stick to your quit plan by providing reminders, tips, and encouragement. There are many free apps, such as QuitGuide from the CDC (Centers for Disease Control and Prevention). You can find other support for quitting smoking (smoking cessation) through smokefree.gov and other websites.  How will I feel when I quit smoking? Within the first 24 hours of quitting smoking, you may start to feel some withdrawal symptoms. These symptoms are usually most noticeable 2-3 days after quitting, but they usually do not last beyond 2-3 weeks. Changes or symptoms that you might experience include:  Mood swings.  Restlessness, anxiety, or irritation.  Difficulty concentrating.  Dizziness.  Strong cravings for sugary foods in addition to nicotine.  Mild weight gain.  Constipation.  Nausea.  Coughing or a sore throat.  Changes in how your medicines work in your  body.  A depressed mood.  Difficulty sleeping (insomnia).  After the first 2-3 weeks of quitting, you may start to notice more positive results, such as:  Improved sense of smell and taste.  Decreased coughing and sore throat.  Slower heart rate.  Lower blood pressure.  Clearer skin.  The ability to breathe more easily.  Fewer sick days.  Quitting smoking is very challenging for most people. Do not get discouraged if you are not successful the first time. Some people need to make many attempts to quit before they achieve long-term success. Do your best to stick to your quit plan, and talk with your health care provider if you have any questions or concerns. This information is not intended to replace advice given to you by your health care provider. Make sure you discuss any questions you have with your health care provider. Document Released: 06/11/2001 Document Revised: 02/13/2016 Document Reviewed: 11/01/2014 Elsevier Interactive Patient Education  2017 [employer] Inc.  

## 2017-02-27 NOTE — Progress Notes (Signed)
Date:  02/25/2017   Name:  Erin Donovan   DOB:  March 28, 1956   MRN:  [id number]  PCP:  Adline Potter, MD    Chief Complaint: Osteoarthritis (follow up )   History of Present Illness:  This is a 61 y.o. female seen for 6 month f/u. Bronchitis sxs resolved. S/p R THR in March, tolerated well, off pain meds, planning L THR soon, feels needs Mobic until then, taking Prilosec prn only. Remains on Forteo for OP with comp fx, wants to complete two year course, vit D level 47 in Jan. Weight stable, exercising more. Still smoking but has cut down. AR sxs well controlled on Zyrtec/Flonase. Recent mammo/colonoscopy ok.  Review of Systems:  Review of Systems  Constitutional: Negative for chills and fever.  Respiratory: Negative for cough and shortness of breath.   Cardiovascular: Negative for chest pain and leg swelling.  Neurological: Negative for syncope and light-headedness.    Patient Active Problem List   Diagnosis Date Noted  . Special screening for malignant neoplasms, colon   . Polyp of sigmoid colon   . Osteoarthritis 09/17/2016  . Allergic rhinitis 08/27/2016  . Age-related osteoporosis without current pathological fracture 07/31/2016  . Hx of compression fracture of spine 07/31/2016  . Overweight (BMI 25.0-29.9) 07/31/2016  . Smoker 07/31/2016    Prior to Admission medications   Medication Sig Start Date End Date Taking? Authorizing Provider  cetirizine (ZYRTEC) 10 MG tablet Take 10 mg by mouth as needed for allergies.   Yes [provider]  Cholecalciferol (VITAMIN D3) 5000 units CAPS Take 1 capsule (5,000 Units total) by mouth daily. 07/31/16  Yes Martyna Thorns, Gwyndolyn Saxon, MD  fluticasone (FLONASE) 50 MCG/ACT nasal spray Place 2 sprays into both nostrils daily. 08/27/16  Yes Jalyiah Shelley, Gwyndolyn Saxon, MD  meloxicam (MOBIC) 15 MG tablet Take 15 mg by mouth daily.   Yes [provider]  Nutritional Supplements (JUICE PLUS FIBRE PO) Take by mouth daily.   Yes [provider]   omeprazole (PRILOSEC) 20 MG capsule Take 1 capsule (20 mg total) by mouth daily as needed. While taking anti inflammatory medicine daily 02/25/17  Yes Neidy Guerrieri, Gwyndolyn Saxon, MD  Teriparatide, Recombinant, (FORTEO [place]) Inject 20 mg into the skin daily. On hold for the procedure   Yes [provider]    Allergies  Allergen Reactions  . No Known Allergies     Past Surgical History:  Procedure Laterality Date  . BASAL CELL CARCINOMA EXCISION  1990s   "right side of my face"  . COLONOSCOPY WITH PROPOFOL N/A 12/05/2016   Procedure: COLONOSCOPY WITH PROPOFOL;  Surgeon: Lucilla Lame, MD;  Location: [facility];  Service: Endoscopy;  Laterality: N/A;  . JOINT REPLACEMENT    . POLYPECTOMY N/A 12/05/2016   Procedure: POLYPECTOMY;  Surgeon: Lucilla Lame, MD;  Location: [facility];  Service: Endoscopy;  Laterality: N/A;  . TOTAL HIP ARTHROPLASTY Right 09/17/2016  . TOTAL HIP ARTHROPLASTY Right 09/17/2016   Procedure: RIGHT TOTAL HIP ARTHROPLASTY ANTERIOR APPROACH;  Surgeon: Renette Butters, MD;  Location: [facility];  Service: Orthopedics;  Laterality: Right;    Social History  Substance Use Topics  . Smoking status: Current Every Day Smoker    Packs/day: 1.00    Years: 46.00    Types: Cigarettes  . Smokeless tobacco: Never Used     Comment: startedage 16  . Alcohol use No    Family History  Problem Relation Age of Onset  . Diabetes Other   . Hypertension Other   .  Osteoporosis Other   . Ovarian cancer Sister 59  . Stroke Sister 27  . Breast cancer Neg Hx     Medication list has been reviewed and updated.  Physical Examination: BP 124/84   Pulse 84   Resp 16   Ht 5\' 5"  (1.651 m)   Wt 172 lb 11.2 oz (78.3 kg)   SpO2 98%   BMI 28.74 kg/m   Physical Exam  Constitutional: She appears well-developed and well-nourished.  Cardiovascular: Normal rate, regular rhythm and normal heart sounds.   Pulmonary/Chest: Effort normal and breath sounds normal.  Neurological: She  is alert.  Skin: Skin is warm and dry.  Psychiatric: She has a normal mood and affect. Her behavior is normal.  Nursing note and vitals reviewed.   Assessment and Plan:  1. Primary osteoarthritis involving multiple joints S/p R THR, for L THR soon, cont Mobic for now but hope can d/c after next procedure  2. Age-related osteoporosis without current pathological fracture Cont Forteo/vit D for two year course  3. Seasonal allergic rhinitis, unspecified trigger Well controlled on Zyrtec/Flonase  4. Overweight (BMI 25.0-29.9) Encouraged exercise/weight loss  5. Smoker Strongly advised cessation  6. Med review Recommend d/c Viactiv, mg oxide  Return in about 6 months (around 08/28/2017).  Satira Anis. [facility]  02/27/2017

## 2017-04-09 ENCOUNTER — Other Ambulatory Visit: Payer: Self-pay | Admitting: Family Medicine

## 2017-04-09 MED ORDER — FLUTICASONE PROPIONATE 50 MCG/ACT NA SUSP: 2.0000 | Freq: Every day | NASAL | 5 refills | Status: DC

## 2017-05-15 DIAGNOSIS — E559 Vitamin D deficiency, unspecified: Secondary | ICD-10-CM | POA: Diagnosis not present

## 2017-05-15 DIAGNOSIS — M810 Age-related osteoporosis without current pathological fracture: Secondary | ICD-10-CM | POA: Diagnosis not present

## 2017-07-31 DIAGNOSIS — M810 Age-related osteoporosis without current pathological fracture: Secondary | ICD-10-CM | POA: Diagnosis not present

## 2017-07-31 DIAGNOSIS — M85852 Other specified disorders of bone density and structure, left thigh: Secondary | ICD-10-CM | POA: Diagnosis not present

## 2017-08-28 ENCOUNTER — Ambulatory Visit: Payer: BLUE CROSS/BLUE SHIELD | Admitting: Family Medicine

## 2017-09-02 ENCOUNTER — Encounter: Payer: Self-pay | Admitting: Family Medicine

## 2017-09-02 ENCOUNTER — Ambulatory Visit: Payer: BLUE CROSS/BLUE SHIELD | Admitting: Family Medicine

## 2017-09-02 VITALS — BP 124/81 | HR 67 | Resp 16 | Ht 65.0 in | Wt 186.4 lb

## 2017-09-02 DIAGNOSIS — M810 Age-related osteoporosis without current pathological fracture: Secondary | ICD-10-CM | POA: Diagnosis not present

## 2017-09-02 DIAGNOSIS — E669 Obesity, unspecified: Secondary | ICD-10-CM

## 2017-09-02 DIAGNOSIS — Z79899 Other long term (current) drug therapy: Secondary | ICD-10-CM | POA: Diagnosis not present

## 2017-09-02 DIAGNOSIS — F17200 Nicotine dependence, unspecified, uncomplicated: Secondary | ICD-10-CM

## 2017-09-02 DIAGNOSIS — M150 Primary generalized (osteo)arthritis: Secondary | ICD-10-CM | POA: Diagnosis not present

## 2017-09-02 DIAGNOSIS — Z0000 Encounter for general adult medical examination without abnormal findings: Secondary | ICD-10-CM

## 2017-09-02 DIAGNOSIS — H6982 Other specified disorders of Eustachian tube, left ear: Secondary | ICD-10-CM

## 2017-09-02 DIAGNOSIS — J302 Other seasonal allergic rhinitis: Secondary | ICD-10-CM

## 2017-09-02 DIAGNOSIS — M159 Polyosteoarthritis, unspecified: Secondary | ICD-10-CM

## 2017-09-02 NOTE — Progress Notes (Signed)
Date:  09/02/2017   Name:  Erin Donovan   DOB:  [birth year]   MRN:  [id number]  PCP:  Adline Potter, MD    Chief Complaint: Osteoporosis   History of Present Illness:  This is a 62 y.o. female seen for six month f/u. Completed two year course Forteo, still on high dose vit D. Remains on Mobic and prn Prilosec for OA, feels needs until gets L THR. Still smoking, aware of risks. C/o L ear popping lately, known AR on Zyrtec/Flonase. Had colonoscopy, showed two benign polyps, repeat 5 years.  Review of Systems:  Review of Systems  Constitutional: Negative for chills and fever.  Respiratory: Negative for cough and shortness of breath.   Cardiovascular: Negative for chest pain and leg swelling.  Genitourinary: Negative for difficulty urinating.  Neurological: Negative for syncope and light-headedness.    Patient Active Problem List   Diagnosis Date Noted  . Current use of proton pump inhibitor 09/02/2017  . Special screening for malignant neoplasms, colon   . Polyp of sigmoid colon   . Osteoarthritis 09/17/2016  . Allergic rhinitis 08/27/2016  . Age-related osteoporosis without current pathological fracture 07/31/2016  . Hx of compression fracture of spine 07/31/2016  . Obesity (BMI 30.0-34.9) 07/31/2016  . Smoker 07/31/2016    Prior to Admission medications   Medication Sig Start Date End Date Taking? Authorizing Provider  cetirizine (ZYRTEC) 10 MG tablet Take 10 mg by mouth as needed for allergies.   Yes [provider]  Cholecalciferol (VITAMIN D3) 5000 units CAPS Take 1 capsule (5,000 Units total) by mouth daily. 07/31/16  Yes Waseem Suess, Gwyndolyn Saxon, MD  fluticasone (FLONASE) 50 MCG/ACT nasal spray Place 2 sprays into both nostrils daily. 04/09/17  Yes Korrie Hofbauer, Gwyndolyn Saxon, MD  meloxicam (MOBIC) 15 MG tablet Take 15 mg by mouth daily.   Yes [provider]  Nutritional Supplements (JUICE PLUS FIBRE PO) Take by mouth daily.   Yes [provider]  omeprazole  (PRILOSEC) 20 MG capsule Take 1 capsule (20 mg total) by mouth daily as needed. While taking anti inflammatory medicine daily 02/25/17  Yes Kameren Pargas, Gwyndolyn Saxon, MD    Allergies  Allergen Reactions  . No Known Allergies     Past Surgical History:  Procedure Laterality Date  . BASAL CELL CARCINOMA EXCISION  1990s   "right side of my face"  . COLONOSCOPY WITH PROPOFOL N/A 12/05/2016   Procedure: COLONOSCOPY WITH PROPOFOL;  Surgeon: Lucilla Lame, MD;  Location: [facility];  Service: Endoscopy;  Laterality: N/A;  . JOINT REPLACEMENT    . POLYPECTOMY N/A 12/05/2016   Procedure: POLYPECTOMY;  Surgeon: Lucilla Lame, MD;  Location: [facility];  Service: Endoscopy;  Laterality: N/A;  . TOTAL HIP ARTHROPLASTY Right 09/17/2016  . TOTAL HIP ARTHROPLASTY Right 09/17/2016   Procedure: RIGHT TOTAL HIP ARTHROPLASTY ANTERIOR APPROACH;  Surgeon: Renette Butters, MD;  Location: [facility];  Service: Orthopedics;  Laterality: Right;    Social History   Tobacco Use  . Smoking status: Current Every Day Smoker    Packs/day: 1.00    Years: 46.00    Pack years: 46.00    Types: Cigarettes  . Smokeless tobacco: Never Used  . Tobacco comment: startedage 16  Substance Use Topics  . Alcohol use: No  . Drug use: No    Family History  Problem Relation Age of Onset  . Diabetes Other   . Hypertension Other   . Osteoporosis Other   . Ovarian cancer Sister 69  .  Stroke Sister 63  . Breast cancer Neg Hx     Medication list has been reviewed and updated.  Physical Examination: BP 124/81   Pulse 67   Resp 16   Ht 5\' 5"  (1.651 m)   Wt 186 lb 6.4 oz (84.6 kg)   SpO2 100%   BMI 31.02 kg/m   Physical Exam  Constitutional: She appears well-developed and well-nourished.  HENT:  Left Ear: External ear normal.  L TM clear  Cardiovascular: Normal rate, regular rhythm and normal heart sounds.  Pulmonary/Chest: Effort normal and breath sounds normal.  Musculoskeletal: She exhibits no edema.   Lymphadenopathy:    She has no cervical adenopathy.  Skin: Skin is warm and dry.  Psychiatric: She has a normal mood and affect. Her behavior is normal.  Nursing note and vitals reviewed.   Assessment and Plan:  1. Dysfunction of left eustachian tube Cont AR meds, consider ENT referral if persists  2. Seasonal allergic rhinitis, unspecified trigger Cont Zyrtec/Flonase daily  3. Age-related osteoporosis without current pathological fracture Off Forteo, on high dose vit D - Vitamin D (25 hydroxy)  4. Primary osteoarthritis involving multiple joints On Mobic, discussed long-term risks, wants to continue until can get L THR  5. Smoker Strongly advised cessation  6. Obesity (BMI 30.0-34.9) Weight up 16#, consider MNT referral  7. Current use of proton pump inhibitor - B12  8. Healthcare maintenance - Hepatitis C Antibody - HIV antibody (with reflex)  Return in about 6 months (around 03/05/2018).  Satira Anis. [facility]  09/02/2017

## 2017-09-03 LAB — VITAMIN D 25 HYDROXY (VIT D DEFICIENCY, FRACTURES): VIT D 25 HYDROXY: 57.7 ng/mL (ref 30.0–100.0)

## 2017-09-03 LAB — HIV ANTIBODY (ROUTINE TESTING W REFLEX): HIV Screen 4th Generation wRfx: NONREACTIVE

## 2017-09-03 LAB — HEPATITIS C ANTIBODY

## 2017-09-03 LAB — VITAMIN B12: Vitamin B-12: 550 pg/mL (ref 232–1245)

## 2017-09-09 DIAGNOSIS — E559 Vitamin D deficiency, unspecified: Secondary | ICD-10-CM | POA: Diagnosis not present

## 2017-09-09 DIAGNOSIS — R5383 Other fatigue: Secondary | ICD-10-CM | POA: Diagnosis not present

## 2017-09-09 DIAGNOSIS — M810 Age-related osteoporosis without current pathological fracture: Secondary | ICD-10-CM | POA: Diagnosis not present

## 2017-09-11 DIAGNOSIS — M810 Age-related osteoporosis without current pathological fracture: Secondary | ICD-10-CM | POA: Diagnosis not present

## 2017-09-11 DIAGNOSIS — M1612 Unilateral primary osteoarthritis, left hip: Secondary | ICD-10-CM | POA: Diagnosis not present

## 2017-11-04 ENCOUNTER — Ambulatory Visit: Payer: BLUE CROSS/BLUE SHIELD | Admitting: Family Medicine

## 2017-11-04 ENCOUNTER — Encounter: Payer: Self-pay | Admitting: Family Medicine

## 2017-11-04 VITALS — BP 124/78 | HR 72 | Ht 65.0 in | Wt 184.0 lb

## 2017-11-04 DIAGNOSIS — J301 Allergic rhinitis due to pollen: Secondary | ICD-10-CM | POA: Diagnosis not present

## 2017-11-04 DIAGNOSIS — L03211 Cellulitis of face: Secondary | ICD-10-CM | POA: Diagnosis not present

## 2017-11-04 DIAGNOSIS — F17200 Nicotine dependence, unspecified, uncomplicated: Secondary | ICD-10-CM

## 2017-11-04 MED ORDER — MUPIROCIN 2 % EX OINT: 1.0000 "application " | TOPICAL_OINTMENT | Freq: Two times a day (BID) | CUTANEOUS | 0 refills | Status: DC

## 2017-11-04 MED ORDER — FLUTICASONE PROPIONATE 50 MCG/ACT NA SUSP: 2.0000 | Freq: Every day | NASAL | 6 refills | Status: DC

## 2017-11-04 MED ORDER — SULFAMETHOXAZOLE-TRIMETHOPRIM 800-160 MG PO TABS: 1.0000 | ORAL_TABLET | Freq: Two times a day (BID) | ORAL | 0 refills | Status: DC

## 2017-11-04 NOTE — Progress Notes (Signed)
Name: Erin Donovan   MRN: [id number]    DOB: 1956/05/08   Date:11/04/2017       Progress Note  Subjective  Chief Complaint  Chief Complaint  Patient presents with  . Rash    raised area on L) side of face- feels warm x 5 days    Rash  This is a new problem. The current episode started in the past 7 days. The problem has been gradually worsening since onset. The affected locations include the face. The rash is characterized by redness. Pertinent negatives include no anorexia, congestion, cough, diarrhea, eye pain, facial edema, fatigue, fever, joint pain, nail changes, rhinorrhea, shortness of breath, sore throat or vomiting. Past treatments include nothing. The treatment provided no relief.    No problem-specific Assessment & Plan notes found for this encounter.   Past Medical History:  Diagnosis Date  . Arthritis, rheumatoid ([place]) 11/2013  . Basal cell carcinoma of skin of face    "right side"  . Chronic lumbar pain    "broke L1 last year" (09/17/2016)  . Dental crowns present    2 implants - right upper - tooth 4 & 5  . Depression    single episode  . Family history of ovarian cancer 01/03/2014   genetic letter sent  . History of mammogram 07/30/2010   neg/cat 2  . Osteoarthritis    "hips" (09/17/2016)  . Osteoporosis 2017   c compression fracture of spine  . Osteosclerosis   . Right hip pain   . Weight gain     Past Surgical History:  Procedure Laterality Date  . BASAL CELL CARCINOMA EXCISION  1990s   "right side of my face"  . COLONOSCOPY WITH PROPOFOL N/A 12/05/2016   Procedure: COLONOSCOPY WITH PROPOFOL;  Surgeon: Lucilla Lame, MD;  Location: [facility];  Service: Endoscopy;  Laterality: N/A;  . JOINT REPLACEMENT    . POLYPECTOMY N/A 12/05/2016   Procedure: POLYPECTOMY;  Surgeon: Lucilla Lame, MD;  Location: [facility];  Service: Endoscopy;  Laterality: N/A;  . TOTAL HIP ARTHROPLASTY Right 09/17/2016  . TOTAL HIP ARTHROPLASTY Right 09/17/2016    Procedure: RIGHT TOTAL HIP ARTHROPLASTY ANTERIOR APPROACH;  Surgeon: Renette Butters, MD;  Location: [facility];  Service: Orthopedics;  Laterality: Right;    Family History  Problem Relation Age of Onset  . Diabetes Other   . Hypertension Other   . Osteoporosis Other   . Ovarian cancer Sister 32  . Stroke Sister 43  . Breast cancer Neg Hx     Social History   Socioeconomic History  . Marital status: Married    Spouse name: Not on file  . Number of children: Not on file  . Years of education: Not on file  . Highest education level: Not on file  Occupational History  . Not on file  Social Needs  . Financial resource strain: Not on file  . Food insecurity:    Worry: Not on file    Inability: Not on file  . Transportation needs:    Medical: Not on file    Non-medical: Not on file  Tobacco Use  . Smoking status: Current Every Day Smoker    Packs/day: 1.00    Years: 46.00    Pack years: 46.00    Types: Cigarettes  . Smokeless tobacco: Never Used  . Tobacco comment: startedage 16- gave info on patches and pills  Substance and Sexual Activity  . Alcohol use: No  . Drug use: No  .  Sexual activity: Yes    Birth control/protection: Post-menopausal  Lifestyle  . Physical activity:    Days per week: Not on file    Minutes per session: Not on file  . Stress: Not on file  Relationships  . Social connections:    Talks on phone: Not on file    Gets together: Not on file    Attends religious service: Not on file    Active member of club or organization: Not on file    Attends meetings of clubs or organizations: Not on file    Relationship status: Not on file  . Intimate partner violence:    Fear of current or ex partner: Not on file    Emotionally abused: Not on file    Physically abused: Not on file    Forced sexual activity: Not on file  Other Topics Concern  . Not on file  Social History Narrative  . Not on file    Allergies  Allergen Reactions  . No Known  Allergies     Outpatient Medications Prior to Visit  Medication Sig Dispense Refill  . cetirizine (ZYRTEC) 10 MG tablet Take 10 mg by mouth as needed for allergies.    . Cholecalciferol (VITAMIN D3) 5000 units CAPS Take 1 capsule (5,000 Units total) by mouth daily. 30 capsule   . fluticasone (FLONASE) 50 MCG/ACT nasal spray Place 2 sprays into both nostrils daily. 16 g 5  . meloxicam (MOBIC) 15 MG tablet Take 15 mg by mouth daily.    Marland Kitchen omeprazole (PRILOSEC) 20 MG capsule Take 1 capsule (20 mg total) by mouth daily as needed. While taking anti inflammatory medicine daily 30 capsule 2  . Nutritional Supplements (JUICE PLUS FIBRE PO) Take by mouth daily.     No facility-administered medications prior to visit.     Review of Systems  Constitutional: Negative for chills, fatigue, fever, malaise/fatigue and weight loss.  HENT: Negative for congestion, ear discharge, ear pain, rhinorrhea and sore throat.   Eyes: Negative for blurred vision and pain.  Respiratory: Negative for cough, sputum production, shortness of breath and wheezing.   Cardiovascular: Negative for chest pain, palpitations and leg swelling.  Gastrointestinal: Negative for abdominal pain, anorexia, blood in stool, constipation, diarrhea, heartburn, melena, nausea and vomiting.  Genitourinary: Negative for dysuria, frequency, hematuria and urgency.  Musculoskeletal: Negative for back pain, joint pain, myalgias and neck pain.  Skin: Positive for rash. Negative for nail changes.  Neurological: Negative for dizziness, tingling, sensory change, focal weakness and headaches.  Endo/Heme/Allergies: Negative for environmental allergies and polydipsia. Does not bruise/bleed easily.  Psychiatric/Behavioral: Negative for depression and suicidal ideas. The patient is not nervous/anxious and does not have insomnia.      Objective  Vitals:   11/04/17 1020  BP: 124/78  Pulse: 72  Weight: 184 lb (83.5 kg)  Height: 5\' 5"  (1.651 m)     Physical Exam  Constitutional: No distress.  HENT:  Head: Normocephalic and atraumatic.  Right Ear: External ear normal.  Left Ear: External ear normal.  Nose: Nose normal.  Mouth/Throat: Oropharynx is clear and moist.  Eyes: Pupils are equal, round, and reactive to light. Conjunctivae and EOM are normal. Right eye exhibits no discharge. Left eye exhibits no discharge.  Neck: Normal range of motion. Neck supple. No JVD present. No thyromegaly present.  Cardiovascular: Normal rate, regular rhythm, normal heart sounds and intact distal pulses. Exam reveals no gallop and no friction rub.  No murmur heard. Pulmonary/Chest: Effort normal and  breath sounds normal.  Abdominal: Soft. Bowel sounds are normal. She exhibits no mass. There is no tenderness. There is no guarding.  Musculoskeletal: Normal range of motion. She exhibits no edema.  Lymphadenopathy:    She has no cervical adenopathy.  Neurological: She is alert. She has normal reflexes.  Skin: Skin is warm and dry. She is not diaphoretic.  Nursing note and vitals reviewed.     Assessment & Plan  Problem List Items Addressed This Visit      Respiratory   Allergic rhinitis   Relevant Medications   fluticasone (FLONASE) 50 MCG/ACT nasal spray    Other Visit Diagnoses    Cellulitis of face    -  Primary   Relevant Medications   sulfamethoxazole-trimethoprim (BACTRIM DS,SEPTRA DS) 800-160 MG tablet   mupirocin ointment (BACTROBAN) 2 %   Tobacco dependence          Meds ordered this encounter  Medications  . sulfamethoxazole-trimethoprim (BACTRIM DS,SEPTRA DS) 800-160 MG tablet    Sig: Take 1 tablet by mouth 2 (two) times daily.    Dispense:  20 tablet    Refill:  0  . mupirocin ointment (BACTROBAN) 2 %    Sig: Apply 1 application topically 2 (two) times daily.    Dispense:  22 g    Refill:  0  . fluticasone (FLONASE) 50 MCG/ACT nasal spray    Sig: Place 2 sprays into both nostrils daily.    Dispense:  16 g     Refill:  6      Dr. Otilio Miu [facility]  11/04/17

## 2018-07-21 ENCOUNTER — Ambulatory Visit (INDEPENDENT_AMBULATORY_CARE_PROVIDER_SITE_OTHER): Payer: BLUE CROSS/BLUE SHIELD

## 2018-07-21 DIAGNOSIS — Z23 Encounter for immunization: Secondary | ICD-10-CM

## 2018-07-23 ENCOUNTER — Other Ambulatory Visit: Payer: Self-pay | Admitting: Family Medicine

## 2018-07-23 DIAGNOSIS — J301 Allergic rhinitis due to pollen: Secondary | ICD-10-CM

## 2018-10-15 ENCOUNTER — Other Ambulatory Visit: Payer: Self-pay | Admitting: Family Medicine

## 2018-10-15 DIAGNOSIS — J301 Allergic rhinitis due to pollen: Secondary | ICD-10-CM

## 2018-12-28 ENCOUNTER — Encounter: Payer: Self-pay | Admitting: Family Medicine

## 2018-12-28 ENCOUNTER — Other Ambulatory Visit: Payer: Self-pay

## 2018-12-28 ENCOUNTER — Ambulatory Visit: Payer: BLUE CROSS/BLUE SHIELD | Admitting: Family Medicine

## 2018-12-28 VITALS — BP 104/70 | HR 70 | Ht 65.0 in | Wt 161.0 lb

## 2018-12-28 DIAGNOSIS — R591 Generalized enlarged lymph nodes: Secondary | ICD-10-CM | POA: Diagnosis not present

## 2018-12-28 DIAGNOSIS — F17210 Nicotine dependence, cigarettes, uncomplicated: Secondary | ICD-10-CM

## 2018-12-28 MED ORDER — FLUTICASONE PROPIONATE 50 MCG/ACT NA SUSP: 2.0000 | Freq: Every day | NASAL | 11 refills | Status: DC

## 2018-12-28 MED ORDER — CHANTIX STARTING MONTH PAK 0.5 MG X 11 & 1 MG X 42 PO TABS: ORAL_TABLET | ORAL | 0 refills | Status: DC

## 2018-12-28 NOTE — Progress Notes (Signed)
Date:  12/28/2018   Name:  Erin Donovan   DOB:  06/20/1956   MRN:  [id number]   Chief Complaint: Establish Care (Plonk pt) and Cyst (hard and can feel it on the R) side of jaw)  Patient presents to discuss need for evaluation of "cyst of the right jaw area.    Review of Systems  Constitutional: Negative for chills and fever.  HENT: Negative for drooling, ear discharge, ear pain and sore throat.   Respiratory: Negative for cough, shortness of breath and wheezing.   Cardiovascular: Negative for chest pain, palpitations and leg swelling.  Gastrointestinal: Negative for abdominal pain, blood in stool, constipation, diarrhea and nausea.  Endocrine: Negative for polydipsia.  Genitourinary: Negative for dysuria, frequency, hematuria and urgency.  Musculoskeletal: Negative for back pain, myalgias and neck pain.  Skin: Negative for rash.  Allergic/Immunologic: Negative for environmental allergies.  Neurological: Negative for dizziness and headaches.  Hematological: Does not bruise/bleed easily.  Psychiatric/Behavioral: Negative for suicidal ideas. The patient is not nervous/anxious.     Patient Active Problem List   Diagnosis Date Noted  . Current use of proton pump inhibitor 09/02/2017  . Special screening for malignant neoplasms, colon   . Polyp of sigmoid colon   . Osteoarthritis 09/17/2016  . Allergic rhinitis 08/27/2016  . Age-related osteoporosis without current pathological fracture 07/31/2016  . Hx of compression fracture of spine 07/31/2016  . Obesity (BMI 30.0-34.9) 07/31/2016  . Smoker 07/31/2016    Allergies  Allergen Reactions  . No Known Allergies     Past Surgical History:  Procedure Laterality Date  . BASAL CELL CARCINOMA EXCISION  1990s   "right side of my face"  . COLONOSCOPY WITH PROPOFOL N/A 12/05/2016   Procedure: COLONOSCOPY WITH PROPOFOL;  Surgeon: Lucilla Lame, MD;  Location: [place];  Service: Endoscopy;  Laterality: N/A;  . JOINT  REPLACEMENT    . POLYPECTOMY N/A 12/05/2016   Procedure: POLYPECTOMY;  Surgeon: Lucilla Lame, MD;  Location: [place];  Service: Endoscopy;  Laterality: N/A;  . TOTAL HIP ARTHROPLASTY Right 09/17/2016  . TOTAL HIP ARTHROPLASTY Right 09/17/2016   Procedure: RIGHT TOTAL HIP ARTHROPLASTY ANTERIOR APPROACH;  Surgeon: Renette Butters, MD;  Location: [place];  Service: Orthopedics;  Laterality: Right;    Social History   Tobacco Use  . Smoking status: Current Every Day Smoker    Packs/day: 1.00    Years: 46.00    Pack years: 46.00    Types: Cigarettes  . Smokeless tobacco: Never Used  . Tobacco comment: startedage 16- gave info on patches and pills  Substance Use Topics  . Alcohol use: No  . Drug use: No     Medication list has been reviewed and updated.  Current Meds  Medication Sig  . cetirizine (ZYRTEC) 10 MG tablet Take 10 mg by mouth as needed for allergies.  . Cholecalciferol (VITAMIN D3) 5000 units CAPS Take 1 capsule (5,000 Units total) by mouth daily.  . fluticasone (FLONASE) 50 MCG/ACT nasal spray Place 2 sprays into both nostrils daily.  . meloxicam (MOBIC) 15 MG tablet Take 15 mg by mouth daily.  . Zinc 50 MG CAPS Take 1 capsule by mouth daily.    PHQ 2/9 Scores 12/28/2018  PHQ - 2 Score 0  PHQ- 9 Score 0    BP Readings from Last 3 Encounters:  12/28/18 104/70  11/04/17 124/78  09/02/17 124/81    Physical Exam Vitals signs and nursing note reviewed.  Constitutional:  General: She is not in acute distress.    Appearance: She is not diaphoretic.  HENT:     Head: Normocephalic and atraumatic.     Right Ear: Tympanic membrane, ear canal and external ear normal. There is no impacted cerumen.     Left Ear: Tympanic membrane, ear canal and external ear normal. There is no impacted cerumen.     Nose: Nose normal. No congestion or rhinorrhea.  Eyes:     General:        Right eye: No discharge.        Left eye: No discharge.     Conjunctiva/sclera:  Conjunctivae normal.     Pupils: Pupils are equal, round, and reactive to light.  Neck:     Musculoskeletal: Normal range of motion and neck supple.     Thyroid: No thyromegaly.     Vascular: No JVD.  Cardiovascular:     Rate and Rhythm: Normal rate and regular rhythm.     Heart sounds: Normal heart sounds. No murmur. No friction rub. No gallop.   Pulmonary:     Effort: Pulmonary effort is normal.     Breath sounds: Normal breath sounds. No stridor, decreased air movement or transmitted upper airway sounds. No decreased breath sounds, wheezing, rhonchi or rales.  Abdominal:     General: Bowel sounds are normal.     Palpations: Abdomen is soft. There is no mass.     Tenderness: There is no abdominal tenderness. There is no guarding.  Musculoskeletal: Normal range of motion.  Lymphadenopathy:     Head:     Right side of head: No submental, submandibular, tonsillar, preauricular, posterior auricular or occipital adenopathy.     Left side of head: No submental, submandibular, tonsillar, preauricular, posterior auricular or occipital adenopathy.     Cervical: No cervical adenopathy.     Comments: ? Palpable node surface of angle right mandible. Normal size,shape and cinsistency  Skin:    General: Skin is warm and dry.  Neurological:     Mental Status: She is alert.     Deep Tendon Reflexes: Reflexes are normal and symmetric.     Wt Readings from Last 3 Encounters:  12/28/18 161 lb (73 kg)  11/04/17 184 lb (83.5 kg)  09/02/17 186 lb 6.4 oz (84.6 kg)    BP 104/70   Pulse 70   Ht 5\' 5"  (1.651 m)   Wt 161 lb (73 kg)   BMI 26.79 kg/m   Assessment and Plan:  1. Lymphadenopathy Patient is here for a second opinion for a "cyst "that is in the neck area.  This is discovered by the patient and is nontender movable and mild in nature.  The size of the nodule or lymph node is approximately half a centimeter normal in consistency as well as movable.  This is in the area of the angle of  the right jaw.  There is no other palpable lymph nodes in any of the chains of note.  She was initially to see an oral surgeon but I think we will have Dr. Farrel Conners take a look at this and evaluate and then determine if we need to do any other investigation of the neck. - Ambulatory referral to ENT  2. Cigarette nicotine dependence without complication Patient is requesting assistance in quitting smoking again.  A long discussion with the patient about cessation of smoking in particular that this has to be her decision and this is not something that she just tries  and not take.  Questioning about Chantix patient said it made her feel lethargic and I pointed out the alternatives on the cigarette packs seem to be somewhat worse than that.  Patient is in agreement that this is the decision she has to make and the timing on her decision and not other people pursuing this.  Patient was given a written prescription for Chantix starting to be initiated at her discretion. - varenicline (CHANTIX STARTING MONTH PAK) 0.5 MG X 11 & 1 MG X 42 tablet; Take one 0.5 mg tablet by mouth once daily for 3 days, then increase to one 0.5 mg tablet twice daily for 4 days, then increase to one 1 mg tablet twice daily.  Dispense: 53 tablet; Refill: 0

## 2018-12-29 ENCOUNTER — Other Ambulatory Visit: Payer: Self-pay | Admitting: Otolaryngology

## 2018-12-29 ENCOUNTER — Other Ambulatory Visit (HOSPITAL_COMMUNITY): Payer: Self-pay | Admitting: Otolaryngology

## 2018-12-29 DIAGNOSIS — H6121 Impacted cerumen, right ear: Secondary | ICD-10-CM | POA: Diagnosis not present

## 2018-12-29 DIAGNOSIS — D110 Benign neoplasm of parotid gland: Secondary | ICD-10-CM | POA: Diagnosis not present

## 2018-12-29 DIAGNOSIS — R221 Localized swelling, mass and lump, neck: Secondary | ICD-10-CM | POA: Diagnosis not present

## 2018-12-29 DIAGNOSIS — Z720 Tobacco use: Secondary | ICD-10-CM | POA: Diagnosis not present

## 2019-01-02 ENCOUNTER — Encounter: Payer: Self-pay | Admitting: Emergency Medicine

## 2019-01-02 ENCOUNTER — Other Ambulatory Visit: Payer: Self-pay

## 2019-01-02 ENCOUNTER — Emergency Department
Admission: EM | Admit: 2019-01-02 | Discharge: 2019-01-02 | Disposition: A | Discharge status: Home / Self Care | Payer: BC Managed Care – PPO | Attending: Emergency Medicine | Admitting: Emergency Medicine

## 2019-01-02 DIAGNOSIS — S81812A Laceration without foreign body, left lower leg, initial encounter: Secondary | ICD-10-CM

## 2019-01-02 DIAGNOSIS — Y92015 Private garage of single-family (private) house as the place of occurrence of the external cause: Secondary | ICD-10-CM | POA: Diagnosis not present

## 2019-01-02 DIAGNOSIS — W230XXA Caught, crushed, jammed, or pinched between moving objects, initial encounter: Secondary | ICD-10-CM | POA: Insufficient documentation

## 2019-01-02 DIAGNOSIS — F17210 Nicotine dependence, cigarettes, uncomplicated: Secondary | ICD-10-CM | POA: Diagnosis not present

## 2019-01-02 DIAGNOSIS — Z96641 Presence of right artificial hip joint: Secondary | ICD-10-CM | POA: Diagnosis not present

## 2019-01-02 DIAGNOSIS — Y999 Unspecified external cause status: Secondary | ICD-10-CM | POA: Insufficient documentation

## 2019-01-02 DIAGNOSIS — Y939 Activity, unspecified: Secondary | ICD-10-CM | POA: Insufficient documentation

## 2019-01-02 DIAGNOSIS — S81811A Laceration without foreign body, right lower leg, initial encounter: Secondary | ICD-10-CM | POA: Insufficient documentation

## 2019-01-02 DIAGNOSIS — Z85828 Personal history of other malignant neoplasm of skin: Secondary | ICD-10-CM | POA: Insufficient documentation

## 2019-01-02 DIAGNOSIS — Z79899 Other long term (current) drug therapy: Secondary | ICD-10-CM | POA: Insufficient documentation

## 2019-01-02 MED ORDER — CEPHALEXIN 500 MG PO CAPS
500.0000 mg | ORAL_CAPSULE | Freq: Once | ORAL | Status: AC
  Administered 2019-01-02: 500 mg via ORAL
  Filled 2019-01-02: qty 1

## 2019-01-02 MED ORDER — CEPHALEXIN 500 MG PO CAPS: 500.0000 mg | ORAL_CAPSULE | Freq: Four times a day (QID) | ORAL | 0 refills | Status: AC

## 2019-01-02 MED ORDER — LIDOCAINE HCL (PF) 1 % IJ SOLN
10.0000 mL | Freq: Once | INTRAMUSCULAR | Status: DC
  Filled 2019-01-02: qty 10

## 2019-01-02 NOTE — ED Notes (Signed)
Pt c/o laceration to her left lower leg. Pt wound has controlled bleeding at this time.

## 2019-01-02 NOTE — ED Provider Notes (Signed)
[facility] Emergency Department Provider Note  ____________________________________________  Time seen: Approximately 12:39 PM  I have reviewed the triage vital signs and the nursing notes.   HISTORY  Chief Complaint Laceration    HPI Erin Donovan is a 63 y.o. female that presents to the emergency department for evaluation of left shin laceration.  Patient was cut on her garage door.  Last tetanus shot was 2 to 3 years ago.   Past Medical History:  Diagnosis Date  . Arthritis, rheumatoid ([place]) 11/2013  . Basal cell carcinoma of skin of face    "right side"  . Chronic lumbar pain    "broke L1 last year" (09/17/2016)  . Dental crowns present    2 implants - right upper - tooth 4 & 5  . Depression    single episode  . Family history of ovarian cancer 01/03/2014   genetic letter sent  . History of mammogram 07/30/2010   neg/cat 2  . Osteoarthritis    "hips" (09/17/2016)  . Osteoporosis 2017   c compression fracture of spine  . Osteosclerosis   . Right hip pain   . Weight gain     Patient Active Problem List   Diagnosis Date Noted  . Current use of proton pump inhibitor 09/02/2017  . Special screening for malignant neoplasms, colon   . Polyp of sigmoid colon   . Osteoarthritis 09/17/2016  . Allergic rhinitis 08/27/2016  . Age-related osteoporosis without current pathological fracture 07/31/2016  . Hx of compression fracture of spine 07/31/2016  . Obesity (BMI 30.0-34.9) 07/31/2016  . Smoker 07/31/2016    Past Surgical History:  Procedure Laterality Date  . BASAL CELL CARCINOMA EXCISION  1990s   "right side of my face"  . COLONOSCOPY WITH PROPOFOL N/A 12/05/2016   Procedure: COLONOSCOPY WITH PROPOFOL;  Surgeon: Lucilla Lame, MD;  Location: [place];  Service: Endoscopy;  Laterality: N/A;  . JOINT REPLACEMENT    . POLYPECTOMY N/A 12/05/2016   Procedure: POLYPECTOMY;  Surgeon: Lucilla Lame, MD;  Location: [place];   Service: Endoscopy;  Laterality: N/A;  . TOTAL HIP ARTHROPLASTY Right 09/17/2016  . TOTAL HIP ARTHROPLASTY Right 09/17/2016   Procedure: RIGHT TOTAL HIP ARTHROPLASTY ANTERIOR APPROACH;  Surgeon: Renette Butters, MD;  Location: [place];  Service: Orthopedics;  Laterality: Right;    Prior to Admission medications   Medication Sig Start Date End Date Taking? Authorizing Provider  cephALEXin (KEFLEX) 500 MG capsule Take 1 capsule (500 mg total) by mouth 4 (four) times daily for 10 days. 01/02/19 01/12/19  Laban Emperor, PA-C  cetirizine (ZYRTEC) 10 MG tablet Take 10 mg by mouth as needed for allergies.    [provider]  Cholecalciferol (VITAMIN D3) 5000 units CAPS Take 1 capsule (5,000 Units total) by mouth daily. 07/31/16   Plonk, Gwyndolyn Saxon, MD  fluticasone (FLONASE) 50 MCG/ACT nasal spray Place 2 sprays into both nostrils daily. 12/28/18   Juline Patch, MD  meloxicam (MOBIC) 15 MG tablet Take 15 mg by mouth daily.    [provider]  varenicline (CHANTIX STARTING MONTH PAK) 0.5 MG X 11 & 1 MG X 42 tablet Take one 0.5 mg tablet by mouth once daily for 3 days, then increase to one 0.5 mg tablet twice daily for 4 days, then increase to one 1 mg tablet twice daily. 12/28/18   Juline Patch, MD  Zinc 50 MG CAPS Take 1 capsule by mouth daily.    [provider]  Allergies No known allergies  Family History  Problem Relation Age of Onset  . Diabetes Other   . Hypertension Other   . Osteoporosis Other   . Ovarian cancer Sister 76  . Stroke Sister 46  . Breast cancer Neg Hx     Social History Social History   Tobacco Use  . Smoking status: Current Every Day Smoker    Packs/day: 1.00    Years: 46.00    Pack years: 46.00    Types: Cigarettes  . Smokeless tobacco: Never Used  . Tobacco comment: startedage 16- gave info on patches and pills  Substance Use Topics  . Alcohol use: No  . Drug use: No     Review of Systems  Constitutional: No  fever/chills Gastrointestinal:  No nausea, no vomiting.  Musculoskeletal: Negative for musculoskeletal pain. Skin: Negative for rash, ecchymosis.  Positive for laceration. Neurological: Negative for numbness or tingling   ____________________________________________   PHYSICAL EXAM:  VITAL SIGNS: ED Triage Vitals  Enc Vitals Group     BP 01/02/19 1015 129/90     Pulse Rate 01/02/19 1015 80     Resp 01/02/19 1015 18     Temp 01/02/19 1015 98.6 F (37 C)     Temp Source 01/02/19 1015 Oral     SpO2 01/02/19 1015 99 %     Weight 01/02/19 1016 161 lb (73 kg)     Height 01/02/19 1016 5\' 5"  (1.651 m)     Head Circumference --      Peak Flow --      Pain Score 01/02/19 1016 5     Pain Loc --      Pain Edu? --      Excl. in [place]? --      Constitutional: Alert and oriented. Well appearing and in no acute distress. Eyes: Conjunctivae are normal. PERRL. EOMI. Head: Atraumatic. ENT:      Ears:      Nose: No congestion/rhinnorhea.      Mouth/Throat: Mucous membranes are moist.  Neck: No stridor.   Cardiovascular: Normal rate, regular rhythm.  Good peripheral circulation. Respiratory: Normal respiratory effort without tachypnea or retractions. Lungs CTAB. Good air entry to the bases with no decreased or absent breath sounds. Musculoskeletal: Full range of motion to all extremities. No gross deformities appreciated. Neurologic:  Normal speech and language. No gross focal neurologic deficits are appreciated.  Skin:  Skin is warm, dry. No rash noted.  4 cm laceration to right shin. Psychiatric: Mood and affect are normal. Speech and behavior are normal. Patient exhibits appropriate insight and judgement.   ____________________________________________   LABS (all labs ordered are listed, but only abnormal results are displayed)  Labs Reviewed - No data to  display ____________________________________________  EKG   ____________________________________________  RADIOLOGY   No results found.  ____________________________________________    PROCEDURES  Procedure(s) performed:    Procedures  LACERATION REPAIR Performed by: Laban Emperor  Consent: Verbal consent obtained.  Consent given by: patient  Prepped and Draped in normal sterile fashion  Wound explored: No foreign bodies   Laceration Location: right shin   Laceration Length: 4 cm  Anesthesia: None  Local anesthetic: lidocaine 1% without epinephrine  Anesthetic total: 8 ml  Irrigation method: syringe  Amount of cleaning: 563ml normal saline  Skin closure: 4-0 nylon  Number of sutures: 11  Technique: Simple interrupted  Patient tolerance: Patient tolerated the procedure well with no immediate complications.  Medications  lidocaine (PF) (XYLOCAINE) 1 %  injection 10 mL (has no administration in time range)  cephALEXin (KEFLEX) capsule 500 mg (500 mg Oral Given 01/02/19 1249)     ____________________________________________   INITIAL IMPRESSION / ASSESSMENT AND PLAN / ED COURSE  Pertinent labs & imaging results that were available during my care of the patient were reviewed by me and considered in my medical decision making (see chart for details).  Review of the [place] CSRS was performed in accordance of the [place] prior to dispensing any controlled drugs.   Patient's diagnosis is consistent with shin laceration.  Vital signs and exam are reassuring.  Laceration was repaired with stitches.  Patient will be discharged home with prescriptions for Keflex. Patient is to follow up with primary care as directed. Patient is given ED precautions to return to the ED for any worsening or new symptoms.   Erin Donovan was evaluated in Emergency Department on 01/02/2019 for the symptoms described in the history of present illness. She was evaluated in the context  of the global COVID-19 pandemic, which necessitated consideration that the patient might be at risk for infection with the SARS-CoV-2 virus that causes COVID-19. Institutional protocols and algorithms that pertain to the evaluation of patients at risk for COVID-19 are in a state of rapid change based on information released by regulatory bodies including the CDC and federal and state organizations. These policies and algorithms were followed during the patient's care in the ED.  ____________________________________________  FINAL CLINICAL IMPRESSION(S) / ED DIAGNOSES  Final diagnoses:  Laceration of left lower extremity, initial encounter      NEW MEDICATIONS STARTED DURING THIS VISIT:  ED Discharge Orders         Ordered    cephALEXin (KEFLEX) 500 MG capsule  4 times daily     01/02/19 1256              This chart was dictated using voice recognition software/Dragon. Despite best efforts to proofread, errors can occur which can change the meaning. Any change was purely unintentional.    Laban Emperor, PA-C 01/02/19 1541    Carrie Mew, MD 01/07/19 1353

## 2019-01-02 NOTE — ED Triage Notes (Signed)
Lacration L lower leg. States garage door came down on leg this am. Dressing to lower leg without bleed through noted.

## 2019-01-02 NOTE — ED Notes (Signed)
AAOx3.  Skin warm and dry.  NAD 

## 2019-01-11 ENCOUNTER — Ambulatory Visit
Admission: RE | Admit: 2019-01-11 | Discharge: 2019-01-11 | Disposition: A | Discharge status: Home / Self Care | Payer: BC Managed Care – PPO | Source: Ambulatory Visit | Attending: Otolaryngology | Admitting: Otolaryngology

## 2019-01-11 ENCOUNTER — Other Ambulatory Visit
Admission: RE | Admit: 2019-01-11 | Discharge: 2019-01-11 | Disposition: A | Discharge status: Home / Self Care | Payer: BC Managed Care – PPO | Source: Ambulatory Visit | Attending: Otolaryngology | Admitting: Otolaryngology

## 2019-01-11 ENCOUNTER — Other Ambulatory Visit: Payer: Self-pay

## 2019-01-11 DIAGNOSIS — D110 Benign neoplasm of parotid gland: Secondary | ICD-10-CM

## 2019-01-11 DIAGNOSIS — R220 Localized swelling, mass and lump, head: Secondary | ICD-10-CM | POA: Diagnosis not present

## 2019-01-11 LAB — CREATININE, SERUM
Creatinine, Ser: 0.81 mg/dL (ref 0.44–1.00)
GFR calc Af Amer: >60 mL/min (ref >60)
GFR calc non Af Amer: >60 mL/min (ref >60)

## 2019-01-11 MED ORDER — IOHEXOL 300 MG/ML  SOLN
75.0000 mL | Freq: Once | INTRAMUSCULAR | Status: AC | PRN
  Administered 2019-01-11: 75 mL via INTRAVENOUS

## 2019-02-09 ENCOUNTER — Other Ambulatory Visit: Payer: Self-pay | Admitting: Family Medicine

## 2019-02-09 DIAGNOSIS — F17210 Nicotine dependence, cigarettes, uncomplicated: Secondary | ICD-10-CM

## 2019-03-09 ENCOUNTER — Other Ambulatory Visit: Payer: Self-pay | Admitting: Family Medicine

## 2019-03-09 DIAGNOSIS — F17210 Nicotine dependence, cigarettes, uncomplicated: Secondary | ICD-10-CM

## 2019-03-10 ENCOUNTER — Other Ambulatory Visit: Payer: Self-pay | Admitting: Family Medicine

## 2019-03-10 DIAGNOSIS — F17210 Nicotine dependence, cigarettes, uncomplicated: Secondary | ICD-10-CM

## 2019-03-11 ENCOUNTER — Other Ambulatory Visit: Payer: Self-pay

## 2019-03-11 DIAGNOSIS — F17210 Nicotine dependence, cigarettes, uncomplicated: Secondary | ICD-10-CM

## 2019-03-11 MED ORDER — VARENICLINE TARTRATE 1 MG PO TABS
1.0000 mg | ORAL_TABLET | Freq: Two times a day (BID) | ORAL | 1 refills | Status: DC
Start: 2019-03-11 — End: 2019-04-05

## 2019-03-11 NOTE — Progress Notes (Unsigned)
Sent in continuation pack

## 2019-04-02 ENCOUNTER — Other Ambulatory Visit: Payer: Self-pay | Admitting: Family Medicine

## 2019-04-02 DIAGNOSIS — F17210 Nicotine dependence, cigarettes, uncomplicated: Secondary | ICD-10-CM

## 2019-04-27 ENCOUNTER — Other Ambulatory Visit: Payer: Self-pay | Admitting: Family Medicine

## 2019-04-27 DIAGNOSIS — F17210 Nicotine dependence, cigarettes, uncomplicated: Secondary | ICD-10-CM

## 2019-05-20 ENCOUNTER — Other Ambulatory Visit: Payer: Self-pay | Admitting: Family Medicine

## 2019-05-20 DIAGNOSIS — F17210 Nicotine dependence, cigarettes, uncomplicated: Secondary | ICD-10-CM

## 2019-06-13 ENCOUNTER — Other Ambulatory Visit: Payer: Self-pay | Admitting: Family Medicine

## 2019-06-13 DIAGNOSIS — F17210 Nicotine dependence, cigarettes, uncomplicated: Secondary | ICD-10-CM

## 2019-07-01 ENCOUNTER — Encounter: Payer: Self-pay | Admitting: Family Medicine

## 2019-07-01 ENCOUNTER — Ambulatory Visit (INDEPENDENT_AMBULATORY_CARE_PROVIDER_SITE_OTHER): Payer: BC Managed Care – PPO | Admitting: Family Medicine

## 2019-07-01 ENCOUNTER — Other Ambulatory Visit: Payer: Self-pay

## 2019-07-01 VITALS — BP 112/70 | HR 60 | Ht 65.0 in | Wt 172.0 lb

## 2019-07-01 DIAGNOSIS — J302 Other seasonal allergic rhinitis: Secondary | ICD-10-CM

## 2019-07-01 DIAGNOSIS — Z87891 Personal history of nicotine dependence: Secondary | ICD-10-CM

## 2019-07-01 DIAGNOSIS — Z23 Encounter for immunization: Secondary | ICD-10-CM

## 2019-07-01 MED ORDER — FLUTICASONE PROPIONATE 50 MCG/ACT NA SUSP: 2.0000 | Freq: Every day | NASAL | 11 refills | Status: DC

## 2019-07-01 NOTE — Progress Notes (Signed)
Date:  07/01/2019   Name:  Erin Donovan   DOB:  08-Aug-1955   MRN:  [id number]   Chief Complaint: Nicotine Dependence (does not need it.), Allergic Rhinitis , and Flu Vaccine  Nicotine Dependence Presents for follow-up visit. Symptoms are negative for fatigue and sore throat. Her urge triggers include company of smokers. The symptoms have been stable. Number of cigarettes per day: quit dec 7th. Compliance with prior treatments has been good.  URI  This is a chronic (for allergies) problem. The current episode started more than 1 year ago. The problem has been gradually improving. There has been no fever. Pertinent negatives include no abdominal pain, congestion, coughing, diarrhea, dysuria, ear pain, headaches, nausea, rash, rhinorrhea, sneezing, sore throat, vomiting or wheezing.    Lab Results  Component Value Date   CREATININE 0.81 01/11/2019   BUN 13 09/06/2016   NA 137 09/06/2016   K 4.1 09/06/2016   CL 104 09/06/2016   CO2 27 09/06/2016   Lab Results  Component Value Date   CHOL 198 07/31/2016   HDL 58 07/31/2016   LDLCALC 118 (H) 07/31/2016   TRIG 110 07/31/2016   CHOLHDL 3.4 07/31/2016   Lab Results  Component Value Date   TSH 0.627 07/31/2016   No results found for: HGBA1C   Review of Systems  Constitutional: Negative.  Negative for chills, fatigue, fever and unexpected weight change.  HENT: Negative for congestion, ear discharge, ear pain, rhinorrhea, sinus pressure, sneezing and sore throat.   Eyes: Negative for photophobia, pain, discharge, redness and itching.  Respiratory: Negative for cough, shortness of breath, wheezing and stridor.   Gastrointestinal: Negative for abdominal pain, blood in stool, constipation, diarrhea, nausea and vomiting.  Endocrine: Negative for cold intolerance, heat intolerance, polydipsia, polyphagia and polyuria.  Genitourinary: Negative for dysuria, flank pain, frequency, hematuria, menstrual problem, pelvic pain, urgency,  vaginal bleeding and vaginal discharge.  Musculoskeletal: Negative for arthralgias, back pain and myalgias.  Skin: Negative for rash.  Allergic/Immunologic: Negative for environmental allergies and food allergies.  Neurological: Negative for dizziness, weakness, light-headedness, numbness and headaches.  Hematological: Negative for adenopathy. Does not bruise/bleed easily.  Psychiatric/Behavioral: Negative for dysphoric mood. The patient is not nervous/anxious.     Patient Active Problem List   Diagnosis Date Noted  . Current use of proton pump inhibitor 09/02/2017  . Special screening for malignant neoplasms, colon   . Polyp of sigmoid colon   . Osteoarthritis 09/17/2016  . Allergic rhinitis 08/27/2016  . Age-related osteoporosis without current pathological fracture 07/31/2016  . Hx of compression fracture of spine 07/31/2016  . Obesity (BMI 30.0-34.9) 07/31/2016  . Smoker 07/31/2016    Allergies  Allergen Reactions  . No Known Allergies     Past Surgical History:  Procedure Laterality Date  . BASAL CELL CARCINOMA EXCISION  1990s   "right side of my face"  . COLONOSCOPY WITH PROPOFOL N/A 12/05/2016   Procedure: COLONOSCOPY WITH PROPOFOL;  Surgeon: Lucilla Lame, MD;  Location: [place];  Service: Endoscopy;  Laterality: N/A;  . JOINT REPLACEMENT    . POLYPECTOMY N/A 12/05/2016   Procedure: POLYPECTOMY;  Surgeon: Lucilla Lame, MD;  Location: [place];  Service: Endoscopy;  Laterality: N/A;  . TOTAL HIP ARTHROPLASTY Right 09/17/2016  . TOTAL HIP ARTHROPLASTY Right 09/17/2016   Procedure: RIGHT TOTAL HIP ARTHROPLASTY ANTERIOR APPROACH;  Surgeon: Renette Butters, MD;  Location: [place];  Service: Orthopedics;  Laterality: Right;    Social History  Tobacco Use  . Smoking status: Former Smoker    Packs/day: 1.00    Years: 46.00    Pack years: 46.00    Types: Cigarettes    Quit date: 06/07/2019    Years since quitting: 0.0  . Smokeless tobacco: Never Used    . Tobacco comment: startedage 16- gave info on patches and pills  Substance Use Topics  . Alcohol use: No  . Drug use: No     Medication list has been reviewed and updated.  Current Meds  Medication Sig  . cetirizine (ZYRTEC) 10 MG tablet Take 10 mg by mouth as needed for allergies. otc  . CHANTIX 1 MG tablet TAKE 1 TABLET BY MOUTH TWICE A DAY  . Cholecalciferol (VITAMIN D3) 5000 units CAPS Take 1 capsule (5,000 Units total) by mouth daily.  . fluticasone (FLONASE) 50 MCG/ACT nasal spray Place 2 sprays into both nostrils daily.  . meloxicam (MOBIC) 15 MG tablet Take 15 mg by mouth daily. [place]/ ortho  . Zinc 50 MG CAPS Take 1 capsule by mouth daily.    PHQ 2/9 Scores 12/28/2018  PHQ - 2 Score 0  PHQ- 9 Score 0    BP Readings from Last 3 Encounters:  07/01/19 112/70  01/02/19 (!) 129/50  12/28/18 104/70    Physical Exam Vitals and nursing note reviewed.  Constitutional:      General: She is not in acute distress.    Appearance: She is not diaphoretic.  HENT:     Head: Normocephalic and atraumatic.     Right Ear: Tympanic membrane, ear canal and external ear normal.     Left Ear: Tympanic membrane, ear canal and external ear normal.     Nose: Nose normal. No congestion or rhinorrhea.  Eyes:     General:        Right eye: No discharge.        Left eye: No discharge.     Conjunctiva/sclera: Conjunctivae normal.     Pupils: Pupils are equal, round, and reactive to light.  Neck:     Thyroid: No thyromegaly.     Vascular: No JVD.  Cardiovascular:     Rate and Rhythm: Normal rate and regular rhythm.     Heart sounds: Normal heart sounds. No murmur. No friction rub. No gallop.   Pulmonary:     Effort: Pulmonary effort is normal.     Breath sounds: Normal breath sounds. No wheezing or rhonchi.  Abdominal:     General: Bowel sounds are normal.     Palpations: Abdomen is soft. There is no mass.     Tenderness: There is no abdominal tenderness. There is no guarding.   Musculoskeletal:        General: Normal range of motion.     Cervical back: Normal range of motion and neck supple.  Lymphadenopathy:     Cervical: No cervical adenopathy.  Skin:    General: Skin is warm and dry.  Neurological:     Mental Status: She is alert.     Deep Tendon Reflexes: Reflexes are normal and symmetric.     Wt Readings from Last 3 Encounters:  07/01/19 172 lb (78 kg)  01/02/19 161 lb (73 kg)  12/28/18 161 lb (73 kg)    BP 112/70   Pulse 60   Ht 5\' 5"  (1.651 m)   Wt 172 lb (78 kg)   BMI 28.62 kg/m   Assessment and Plan:  1. Seasonal allergic rhinitis, unspecified trigger Seasonal allergies  controlled with Flonase.  Patient will continue Flonase on an as-needed basis 1 puff in each nostril once a day.  2. Quit smoking Congratulations!  Patient has been able to quit smoking and her last puff was on December 7.  Patient was successful with Chantix and we discussed the possibility of recurrence and patient is aware that this is an ongoing battle that she is going to have to be aware of.

## 2019-07-01 NOTE — Addendum Note (Signed)
Addended by: Berton Mount L on: 07/01/2019 11:03 AM   Modules accepted: Orders

## 2019-07-05 DIAGNOSIS — Z720 Tobacco use: Secondary | ICD-10-CM | POA: Diagnosis not present

## 2019-07-05 DIAGNOSIS — K142 Median rhomboid glossitis: Secondary | ICD-10-CM | POA: Diagnosis not present

## 2019-07-05 DIAGNOSIS — D110 Benign neoplasm of parotid gland: Secondary | ICD-10-CM | POA: Diagnosis not present

## 2019-07-05 DIAGNOSIS — K080 Exfoliation of teeth due to systemic causes: Secondary | ICD-10-CM | POA: Diagnosis not present

## 2019-07-13 DIAGNOSIS — K142 Median rhomboid glossitis: Secondary | ICD-10-CM | POA: Diagnosis not present

## 2019-07-13 DIAGNOSIS — Z720 Tobacco use: Secondary | ICD-10-CM | POA: Diagnosis not present

## 2019-07-13 DIAGNOSIS — D110 Benign neoplasm of parotid gland: Secondary | ICD-10-CM | POA: Diagnosis not present

## 2019-10-18 DIAGNOSIS — M545 Low back pain: Secondary | ICD-10-CM | POA: Diagnosis not present

## 2019-10-18 DIAGNOSIS — M1612 Unilateral primary osteoarthritis, left hip: Secondary | ICD-10-CM | POA: Diagnosis not present

## 2019-10-21 ENCOUNTER — Ambulatory Visit: Payer: BC Managed Care – PPO | Admitting: Family Medicine

## 2019-10-21 ENCOUNTER — Encounter: Payer: Self-pay | Admitting: Family Medicine

## 2019-10-21 ENCOUNTER — Other Ambulatory Visit: Payer: Self-pay

## 2019-10-21 VITALS — BP 112/62 | HR 64 | Ht 65.0 in | Wt 167.0 lb

## 2019-10-21 DIAGNOSIS — Z01818 Encounter for other preprocedural examination: Secondary | ICD-10-CM | POA: Diagnosis not present

## 2019-10-21 DIAGNOSIS — E7801 Familial hypercholesterolemia: Secondary | ICD-10-CM | POA: Diagnosis not present

## 2019-10-21 NOTE — Progress Notes (Signed)
Date:  10/21/2019   Name:  Erin Donovan   DOB:  04-08-1956   MRN:  [id number]   Chief Complaint: Pre-op Exam  Patient is a 64 year old female who presents for a comprehensive physical exam. The patient reports the following problems: none/hip pain. Health maintenance has been reviewed up to date.   Lab Results  Component Value Date   CREATININE 0.81 01/11/2019   BUN 13 09/06/2016   NA 137 09/06/2016   K 4.1 09/06/2016   CL 104 09/06/2016   CO2 27 09/06/2016   Lab Results  Component Value Date   CHOL 198 07/31/2016   HDL 58 07/31/2016   LDLCALC 118 (H) 07/31/2016   TRIG 110 07/31/2016   CHOLHDL 3.4 07/31/2016   Lab Results  Component Value Date   TSH 0.627 07/31/2016   No results found for: HGBA1C Lab Results  Component Value Date   WBC 13.1 (H) 09/18/2016   HGB 10.3 (L) 09/18/2016   HCT 31.3 (L) 09/18/2016   MCV 88.9 09/18/2016   PLT 255 09/18/2016   Lab Results  Component Value Date   ALT 15 07/31/2016   AST 18 07/31/2016   ALKPHOS 117 07/31/2016   BILITOT 0.3 07/31/2016     Review of Systems  Constitutional: Negative.  Negative for chills, fatigue, fever and unexpected weight change.  HENT: Negative for congestion, ear discharge, ear pain, rhinorrhea, sinus pressure, sneezing and sore throat.   Eyes: Negative for photophobia, pain, discharge, redness and itching.  Respiratory: Negative for cough, shortness of breath, wheezing and stridor.   Gastrointestinal: Negative for abdominal pain, blood in stool, constipation, diarrhea, nausea and vomiting.  Endocrine: Negative for cold intolerance, heat intolerance, polydipsia, polyphagia and polyuria.  Genitourinary: Negative for dysuria, flank pain, frequency, hematuria, menstrual problem, pelvic pain, urgency, vaginal bleeding and vaginal discharge.  Musculoskeletal: Negative for arthralgias, back pain and myalgias.  Skin: Negative for rash.  Allergic/Immunologic: Negative for environmental allergies and  food allergies.  Neurological: Negative for dizziness, weakness, light-headedness, numbness and headaches.  Hematological: Negative for adenopathy. Does not bruise/bleed easily.  Psychiatric/Behavioral: Negative for dysphoric mood. The patient is not nervous/anxious.     Patient Active Problem List   Diagnosis Date Noted  . Current use of proton pump inhibitor 09/02/2017  . Special screening for malignant neoplasms, colon   . Polyp of sigmoid colon   . Osteoarthritis 09/17/2016  . Allergic rhinitis 08/27/2016  . Age-related osteoporosis without current pathological fracture 07/31/2016  . Hx of compression fracture of spine 07/31/2016  . Obesity (BMI 30.0-34.9) 07/31/2016  . Smoker 07/31/2016    Allergies  Allergen Reactions  . No Known Allergies     Past Surgical History:  Procedure Laterality Date  . BASAL CELL CARCINOMA EXCISION  1990s   "right side of my face"  . COLONOSCOPY WITH PROPOFOL N/A 12/05/2016   Procedure: COLONOSCOPY WITH PROPOFOL;  Surgeon: Lucilla Lame, MD;  Location: [place];  Service: Endoscopy;  Laterality: N/A;  . JOINT REPLACEMENT    . POLYPECTOMY N/A 12/05/2016   Procedure: POLYPECTOMY;  Surgeon: Lucilla Lame, MD;  Location: [place];  Service: Endoscopy;  Laterality: N/A;  . TOTAL HIP ARTHROPLASTY Right 09/17/2016  . TOTAL HIP ARTHROPLASTY Right 09/17/2016   Procedure: RIGHT TOTAL HIP ARTHROPLASTY ANTERIOR APPROACH;  Surgeon: Renette Butters, MD;  Location: [place];  Service: Orthopedics;  Laterality: Right;    Social History   Tobacco Use  . Smoking status: Former Smoker  Packs/day: 1.00    Years: 46.00    Pack years: 46.00    Types: Cigarettes    Quit date: 06/07/2019    Years since quitting: 0.3  . Smokeless tobacco: Never Used  . Tobacco comment: startedage 16- gave info on patches and pills  Substance Use Topics  . Alcohol use: No  . Drug use: No     Medication list has been reviewed and updated.  Current Meds    Medication Sig  . cetirizine (ZYRTEC) 10 MG tablet Take 10 mg by mouth as needed for allergies. otc  . Cholecalciferol (VITAMIN D3) 5000 units CAPS Take 1 capsule (5,000 Units total) by mouth daily.  . fluticasone (FLONASE) 50 MCG/ACT nasal spray Place 2 sprays into both nostrils daily.  Marland Kitchen FOLIC ACID PO Take 1 tablet by mouth daily.  . meloxicam (MOBIC) 15 MG tablet Take 15 mg by mouth daily. Weiner/ ortho  . Potassium (POTASSIMIN PO) Take by mouth.  . Zinc 50 MG CAPS Take 1 capsule by mouth daily.    PHQ 2/9 Scores 10/21/2019 12/28/2018  PHQ - 2 Score 0 0  PHQ- 9 Score 0 0    BP Readings from Last 3 Encounters:  10/21/19 112/62  07/01/19 112/70  01/02/19 (!) 129/50    Physical Exam Vitals and nursing note reviewed.  Constitutional:      General: She is not in acute distress.    Appearance: Normal appearance. She is well-groomed. She is not diaphoretic.  HENT:     Head: Normocephalic and atraumatic.     Jaw: There is normal jaw occlusion.     Right Ear: Hearing, tympanic membrane, ear canal and external ear normal.     Left Ear: Hearing, tympanic membrane, ear canal and external ear normal.     Nose: Nose normal. No congestion or rhinorrhea.     Right Turbinates: Not enlarged.     Left Turbinates: Not enlarged.     Mouth/Throat:     Lips: Pink.     Mouth: Mucous membranes are moist. Mucous membranes are pale.     Palate: No mass.     Pharynx: Oropharynx is clear. Uvula midline.  Eyes:     General: Lids are normal. Vision grossly intact. Gaze aligned appropriately.        Right eye: No discharge.        Left eye: No discharge.     Conjunctiva/sclera: Conjunctivae normal.     Pupils: Pupils are equal, round, and reactive to light.  Neck:     Thyroid: No thyroid mass, thyromegaly or thyroid tenderness.     Vascular: Normal carotid pulses. No carotid bruit, hepatojugular reflux or JVD.  Cardiovascular:     Rate and Rhythm: Normal rate and regular rhythm.     Chest Wall:  PMI is not displaced.     Pulses: Normal pulses.          Carotid pulses are 2+ on the right side and 2+ on the left side.      Radial pulses are 2+ on the right side and 2+ on the left side.       Femoral pulses are 2+ on the right side and 2+ on the left side.      Popliteal pulses are 2+ on the right side and 2+ on the left side.       Dorsalis pedis pulses are 2+ on the right side and 2+ on the left side.       Posterior  tibial pulses are 2+ on the right side and 2+ on the left side.     Heart sounds: Normal heart sounds, S1 normal and S2 normal. No murmur. No systolic murmur. No diastolic murmur. No friction rub. No gallop. No S3 or S4 sounds.   Pulmonary:     Effort: Pulmonary effort is normal. No respiratory distress.     Breath sounds: Normal breath sounds. No stridor. No decreased breath sounds, wheezing, rhonchi or rales.  Chest:     Chest wall: No tenderness.     Breasts:        Right: Normal.        Left: Normal.  Abdominal:     General: Bowel sounds are normal.     Palpations: Abdomen is soft. There is no hepatomegaly, splenomegaly or mass.     Tenderness: There is no abdominal tenderness. There is no guarding.  Musculoskeletal:        General: Normal range of motion.     Cervical back: Full passive range of motion without pain, normal range of motion and neck supple.  Lymphadenopathy:     Cervical: No cervical adenopathy.     Right cervical: No superficial cervical adenopathy.    Left cervical: No superficial cervical adenopathy.  Skin:    General: Skin is warm and dry.     Capillary Refill: Capillary refill takes less than 2 seconds.  Neurological:     Mental Status: She is alert.     Cranial Nerves: Cranial nerves are intact.     Sensory: Sensation is intact.     Motor: Motor function is intact.     Deep Tendon Reflexes: Reflexes are normal and symmetric.     Reflex Scores:      Tricep reflexes are 2+ on the right side and 2+ on the left side.      Bicep  reflexes are 2+ on the right side and 2+ on the left side.      Brachioradialis reflexes are 2+ on the right side and 2+ on the left side.      Patellar reflexes are 2+ on the right side and 2+ on the left side.      Achilles reflexes are 2+ on the right side and 2+ on the left side.    Wt Readings from Last 3 Encounters:  10/21/19 167 lb (75.8 kg)  07/01/19 172 lb (78 kg)  01/02/19 161 lb (73 kg)    BP 112/62   Pulse 64   Ht 5\' 5"  (1.651 m)   Wt 167 lb (75.8 kg)   BMI 27.79 kg/m   Assessment and Plan: 1. Preop examination No subjective/objective concerns noted on the history/physical exam.  Patient's chart was reviewed from previous visits, most recent labs, most recent imaging, and care everywhere.  Per protocol EKG was done to evaluate for cardiac concern:I have reviewed EKG which shows review of EKG notes that the rate is 59 with normal intervals and no dysrhythmia.  There is no criteria noted that needs LVH criteria.  There is no ischemic concerns: No Q waves, normal R wave progression, no ST-T wave concerns.  There is no contraindication from a cardiac standpoint for surgery.. Comparison to previous EKG dated none for comparison.  Physical exam notes no abnormalities for contraindications to surgery. - EKG 12-Lead - Renal Function Panel - Hemoglobin A1c - CBC with Differential/Platelet  2. Familial hypercholesterolemia Patient with previous history of elevated LDH we will check a lipid panel for evaluation  of hyperlipidemia. - Lipid Panel With LDL/HDL Ratio

## 2019-10-22 LAB — LIPID PANEL WITH LDL/HDL RATIO
Cholesterol, Total: 189 mg/dL (ref 100–199)
HDL: 70 mg/dL (ref >39)
LDL Chol Calc (NIH): 111 mg/dL — ABNORMAL HIGH (ref 0–99)
LDL/HDL Ratio: 1.6 ratio (ref 0.0–3.2)
Triglycerides: 43 mg/dL (ref 0–149)
VLDL Cholesterol Cal: 8 mg/dL (ref 5–40)

## 2019-10-22 LAB — CBC WITH DIFFERENTIAL/PLATELET
Basophils Absolute: 0 10*3/uL (ref 0.0–0.2)
Basos: 1 %
EOS (ABSOLUTE): 0.1 10*3/uL (ref 0.0–0.4)
Eos: 1 %
Hematocrit: 41.2 % (ref 34.0–46.6)
Hemoglobin: 13.5 g/dL (ref 11.1–15.9)
Immature Grans (Abs): 0 10*3/uL (ref 0.0–0.1)
Immature Granulocytes: 0 %
Lymphocytes Absolute: 1.9 10*3/uL (ref 0.7–3.1)
Lymphs: 30 %
MCH: 29.7 pg (ref 26.6–33.0)
MCHC: 32.8 g/dL (ref 31.5–35.7)
MCV: 91 fL (ref 79–97)
Monocytes Absolute: 0.5 10*3/uL (ref 0.1–0.9)
Monocytes: 8 %
Neutrophils Absolute: 3.8 10*3/uL (ref 1.4–7.0)
Neutrophils: 60 %
Platelets: 245 10*3/uL (ref 150–450)
RBC: 4.54 x10E6/uL (ref 3.77–5.28)
RDW: 12.7 % (ref 11.7–15.4)
WBC: 6.3 10*3/uL (ref 3.4–10.8)

## 2019-10-22 LAB — RENAL FUNCTION PANEL
Albumin: 4.4 g/dL (ref 3.8–4.8)
BUN/Creatinine Ratio: 30 — ABNORMAL HIGH (ref 12–28)
BUN: 25 mg/dL (ref 8–27)
CO2: 23 mmol/L (ref 20–29)
Calcium: 9.3 mg/dL (ref 8.7–10.3)
Chloride: 103 mmol/L (ref 96–106)
Creatinine, Ser: 0.83 mg/dL (ref 0.57–1.00)
GFR calc Af Amer: 87 mL/min/{1.73_m2} (ref >59)
GFR calc non Af Amer: 76 mL/min/{1.73_m2} (ref >59)
Glucose: 77 mg/dL (ref 65–99)
Phosphorus: 4 mg/dL (ref 3.0–4.3)
Potassium: 4.6 mmol/L (ref 3.5–5.2)
Sodium: 139 mmol/L (ref 134–144)

## 2019-10-22 LAB — HEMOGLOBIN A1C
Est. average glucose Bld gHb Est-mCnc: 105 mg/dL
Hgb A1c MFr Bld: 5.3 % (ref 4.8–5.6)

## 2019-11-01 DIAGNOSIS — M1612 Unilateral primary osteoarthritis, left hip: Secondary | ICD-10-CM | POA: Diagnosis not present

## 2019-11-01 DIAGNOSIS — M25552 Pain in left hip: Secondary | ICD-10-CM | POA: Diagnosis not present

## 2019-11-11 DIAGNOSIS — M1612 Unilateral primary osteoarthritis, left hip: Secondary | ICD-10-CM | POA: Diagnosis not present

## 2020-01-11 DIAGNOSIS — D110 Benign neoplasm of parotid gland: Secondary | ICD-10-CM | POA: Diagnosis not present

## 2020-06-12 DIAGNOSIS — H10233 Serous conjunctivitis, except viral, bilateral: Secondary | ICD-10-CM | POA: Diagnosis not present

## 2020-06-20 ENCOUNTER — Other Ambulatory Visit: Payer: Self-pay

## 2020-06-20 ENCOUNTER — Ambulatory Visit (INDEPENDENT_AMBULATORY_CARE_PROVIDER_SITE_OTHER): Payer: BC Managed Care – PPO

## 2020-06-20 DIAGNOSIS — Z23 Encounter for immunization: Secondary | ICD-10-CM

## 2020-09-08 ENCOUNTER — Ambulatory Visit (INDEPENDENT_AMBULATORY_CARE_PROVIDER_SITE_OTHER): Payer: BC Managed Care – PPO | Admitting: Podiatry

## 2020-09-08 ENCOUNTER — Encounter: Payer: Self-pay | Admitting: Podiatry

## 2020-09-08 ENCOUNTER — Other Ambulatory Visit: Payer: Self-pay

## 2020-09-08 DIAGNOSIS — L600 Ingrowing nail: Secondary | ICD-10-CM | POA: Diagnosis not present

## 2020-09-08 MED ORDER — GENTAMICIN SULFATE 0.1 % EX CREA: 1.0000 "application " | TOPICAL_CREAM | Freq: Two times a day (BID) | CUTANEOUS | 1 refills | Status: DC

## 2020-09-08 NOTE — Patient Instructions (Signed)

## 2020-09-08 NOTE — Progress Notes (Signed)
   Subjective: Patient presents today for evaluation of pain to the lateral border left great toe. Patient is concerned for possible ingrown nail. Patient presents today for further treatment and evaluation.  Past Medical History:  Diagnosis Date  . Arthritis, rheumatoid ([place]) 11/2013  . Basal cell carcinoma of skin of face    "right side"  . Chronic lumbar pain    "broke L1 last year" (09/17/2016)  . Dental crowns present    2 implants - right upper - tooth 4 & 5  . Depression    single episode  . Family history of ovarian cancer 01/03/2014   genetic letter sent  . History of mammogram 07/30/2010   neg/cat 2  . Osteoarthritis    "hips" (09/17/2016)  . Osteoporosis 2017   c compression fracture of spine  . Osteosclerosis   . Right hip pain   . Weight gain     Objective:  General: Well developed, nourished, in no acute distress, alert and oriented x3   Dermatology: Skin is warm, dry and supple bilateral.  Lateral border left great toe appears to be erythematous with evidence of an ingrowing nail. Pain on palpation noted to the border of the nail fold. The remaining nails appear unremarkable at this time. There are no open sores, lesions.  Vascular: Dorsalis Pedis artery and Posterior Tibial artery pedal pulses palpable. No lower extremity edema noted.   Neruologic: Grossly intact via light touch bilateral.  Musculoskeletal: Muscular strength within normal limits in all groups bilateral. Normal range of motion noted to all pedal and ankle joints.   Assesement: #1 Paronychia with ingrowing nail lateral border left great toe #2 Pain in toe #3 Incurvated nail  Plan of Care:  1. Patient evaluated.  2. Discussed treatment alternatives and plan of care. Explained nail avulsion procedure and post procedure course to patient. 3. Patient opted for permanent partial nail avulsion of the lateral border left great toe.  4. Prior to procedure, local anesthesia infiltration utilized  using 3 ml of a 50:50 mixture of 2% plain lidocaine and 0.5% plain marcaine in a normal hallux block fashion and a betadine prep performed.  5. Partial permanent nail avulsion with chemical matrixectomy performed using [id number] applications of phenol followed by alcohol flush.  6. Light dressing applied. 7.  Prescription for gentamicin cream  8.  Return to clinic 2 weeks.  Edrick Kins, DPM [facility]  Dr. Edrick Kins, DPM    [address], [place] [address]                Office [phone]  Fax [phone]

## 2020-09-13 ENCOUNTER — Other Ambulatory Visit: Payer: Self-pay | Admitting: Family Medicine

## 2020-09-13 NOTE — Telephone Encounter (Signed)
Requested medications are due for refill today.  yes  Requested medications are on the active medications list.  yes  Last refill. 07/01/2019  Future visit scheduled.   no  Notes to clinic.  Over 1 year since last appointment.

## 2020-09-14 ENCOUNTER — Encounter: Payer: Self-pay | Admitting: Family Medicine

## 2020-09-14 ENCOUNTER — Ambulatory Visit: Payer: BC Managed Care – PPO | Admitting: Family Medicine

## 2020-09-14 ENCOUNTER — Other Ambulatory Visit: Payer: Self-pay

## 2020-09-14 VITALS — BP 116/80 | HR 76 | Ht 65.0 in | Wt 156.0 lb

## 2020-09-14 DIAGNOSIS — J302 Other seasonal allergic rhinitis: Secondary | ICD-10-CM

## 2020-09-14 MED ORDER — FLUTICASONE PROPIONATE 50 MCG/ACT NA SUSP: 2.0000 | Freq: Every day | NASAL | 11 refills | Status: DC

## 2020-09-14 NOTE — Progress Notes (Signed)
Date:  09/14/2020   Name:  Erin Donovan   DOB:  [birth year]   MRN:  [id number]   Chief Complaint: Allergic Rhinitis   URI  This is a new (for allergic rhinitis) problem. The current episode started more than 1 year ago. The problem has been waxing and waning. There has been no fever. Associated symptoms include congestion. Pertinent negatives include no abdominal pain, chest pain, coughing, diarrhea, dysuria, ear pain, headaches, joint pain, joint swelling, nausea, neck pain, plugged ear sensation, rash, rhinorrhea, sinus pain, sneezing, sore throat, swollen glands, vomiting or wheezing. Treatments tried: flonase. The treatment provided mild relief.    Lab Results  Component Value Date   CREATININE 0.83 10/21/2019   BUN 25 10/21/2019   NA 139 10/21/2019   K 4.6 10/21/2019   CL 103 10/21/2019   CO2 23 10/21/2019   Lab Results  Component Value Date   CHOL 189 10/21/2019   HDL 70 10/21/2019   LDLCALC 111 (H) 10/21/2019   TRIG 43 10/21/2019   CHOLHDL 3.4 07/31/2016   Lab Results  Component Value Date   TSH 0.627 07/31/2016   Lab Results  Component Value Date   HGBA1C 5.3 10/21/2019   Lab Results  Component Value Date   WBC 6.3 10/21/2019   HGB 13.5 10/21/2019   HCT 41.2 10/21/2019   MCV 91 10/21/2019   PLT 245 10/21/2019   Lab Results  Component Value Date   ALT 15 07/31/2016   AST 18 07/31/2016   ALKPHOS 117 07/31/2016   BILITOT 0.3 07/31/2016     Review of Systems  Constitutional: Negative.  Negative for chills, fatigue, fever and unexpected weight change.  HENT: Positive for congestion. Negative for ear discharge, ear pain, rhinorrhea, sinus pressure, sinus pain, sneezing and sore throat.   Eyes: Negative for photophobia, pain, discharge, redness and itching.  Respiratory: Negative for cough, shortness of breath, wheezing and stridor.   Cardiovascular: Negative for chest pain.  Gastrointestinal: Negative for abdominal pain, blood in stool, constipation,  diarrhea, nausea and vomiting.  Endocrine: Negative for cold intolerance, heat intolerance, polydipsia, polyphagia and polyuria.  Genitourinary: Negative for dysuria, flank pain, frequency, hematuria, menstrual problem, pelvic pain, urgency, vaginal bleeding and vaginal discharge.  Musculoskeletal: Negative for arthralgias, back pain, joint pain, myalgias and neck pain.  Skin: Negative for rash.  Allergic/Immunologic: Negative for environmental allergies and food allergies.  Neurological: Negative for dizziness, weakness, light-headedness, numbness and headaches.  Hematological: Negative for adenopathy. Does not bruise/bleed easily.  Psychiatric/Behavioral: Negative for dysphoric mood. The patient is not nervous/anxious.     Patient Active Problem List   Diagnosis Date Noted  . Current use of proton pump inhibitor 09/02/2017  . Special screening for malignant neoplasms, colon   . Polyp of sigmoid colon   . Osteoarthritis 09/17/2016  . Allergic rhinitis 08/27/2016  . Age-related osteoporosis without current pathological fracture 07/31/2016  . Hx of compression fracture of spine 07/31/2016  . Obesity (BMI 30.0-34.9) 07/31/2016  . Smoker 07/31/2016    Allergies  Allergen Reactions  . No Known Allergies     Past Surgical History:  Procedure Laterality Date  . BASAL CELL CARCINOMA EXCISION  1990s   "right side of my face"  . COLONOSCOPY WITH PROPOFOL N/A 12/05/2016   Procedure: COLONOSCOPY WITH PROPOFOL;  Surgeon: Lucilla Lame, MD;  Location: [place];  Service: Endoscopy;  Laterality: N/A;  . JOINT REPLACEMENT    . POLYPECTOMY N/A 12/05/2016   Procedure: POLYPECTOMY;  Surgeon:  Lucilla Lame, MD;  Location: [place];  Service: Endoscopy;  Laterality: N/A;  . TOTAL HIP ARTHROPLASTY Right 09/17/2016  . TOTAL HIP ARTHROPLASTY Right 09/17/2016   Procedure: RIGHT TOTAL HIP ARTHROPLASTY ANTERIOR APPROACH;  Surgeon: Renette Butters, MD;  Location: [place];  Service:  Orthopedics;  Laterality: Right;    Social History   Tobacco Use  . Smoking status: Former Smoker    Packs/day: 1.00    Years: 46.00    Pack years: 46.00    Types: Cigarettes    Quit date: 06/07/2019    Years since quitting: 1.2  . Smokeless tobacco: Never Used  . Tobacco comment: startedage 16- gave info on patches and pills  Vaping Use  . Vaping Use: Never used  Substance Use Topics  . Alcohol use: No  . Drug use: No     Medication list has been reviewed and updated.  Current Meds  Medication Sig  . cetirizine (ZYRTEC) 10 MG tablet Take 10 mg by mouth as needed for allergies. otc  . Cholecalciferol (VITAMIN D3) 5000 units CAPS Take 1 capsule (5,000 Units total) by mouth daily.  . fluticasone (FLONASE) 50 MCG/ACT nasal spray Place 2 sprays into both nostrils daily.  Marland Kitchen FOLIC ACID PO Take 1 tablet by mouth daily.  Marland Kitchen gentamicin cream (GARAMYCIN) 0.1 % Apply 1 application topically 2 (two) times daily.  . meloxicam (MOBIC) 15 MG tablet Take 15 mg by mouth daily. Weiner/ ortho  . Potassium (POTASSIMIN PO) Take by mouth.  . Zinc 50 MG CAPS Take 1 capsule by mouth daily.    PHQ 2/9 Scores 09/14/2020 10/21/2019 12/28/2018  PHQ - 2 Score 0 0 0  PHQ- 9 Score 0 0 0    GAD 7 : Generalized Anxiety Score 09/14/2020 10/21/2019  Nervous, Anxious, on Edge 0 0  Control/stop worrying 0 0  Worry too much - different things 0 0  Trouble relaxing 0 0  Restless 0 0  Easily annoyed or irritable 0 0  Afraid - awful might happen 0 0  Total GAD 7 Score 0 0  Anxiety Difficulty - Not difficult at all    BP Readings from Last 3 Encounters:  09/14/20 116/80  10/21/19 112/62  07/01/19 112/70    Physical Exam Vitals and nursing note reviewed.  Constitutional:      Appearance: She is well-developed.  HENT:     Head: Normocephalic.     Right Ear: Tympanic membrane and external ear normal.     Left Ear: Tympanic membrane and external ear normal.  Eyes:     General: Lids are everted, no  foreign bodies appreciated. No scleral icterus.       Left eye: No foreign body or hordeolum.     Conjunctiva/sclera: Conjunctivae normal.     Right eye: Right conjunctiva is not injected.     Left eye: Left conjunctiva is not injected.     Pupils: Pupils are equal, round, and reactive to light.  Neck:     Thyroid: No thyromegaly.     Vascular: No JVD.     Trachea: No tracheal deviation.  Cardiovascular:     Rate and Rhythm: Normal rate and regular rhythm.     Chest Wall: PMI is not displaced.     Pulses: Normal pulses.     Heart sounds: Normal heart sounds, S1 normal and S2 normal. No murmur heard.  No systolic murmur is present.  No diastolic murmur is present. No friction rub. No gallop. No  S3 or S4 sounds.   Pulmonary:     Effort: Pulmonary effort is normal. No respiratory distress.     Breath sounds: Normal breath sounds. No decreased breath sounds, wheezing, rhonchi or rales.  Abdominal:     General: Bowel sounds are normal.     Palpations: Abdomen is soft. There is no mass.     Tenderness: There is no abdominal tenderness. There is no guarding or rebound.  Musculoskeletal:        General: No tenderness. Normal range of motion.     Cervical back: Normal range of motion and neck supple.     Right lower leg: No edema.     Left lower leg: No edema.  Lymphadenopathy:     Cervical: No cervical adenopathy.  Skin:    General: Skin is warm.     Findings: No rash.  Neurological:     Mental Status: She is alert and oriented to person, place, and time.     Cranial Nerves: No cranial nerve deficit.     Deep Tendon Reflexes: Reflexes normal.  Psychiatric:        Mood and Affect: Mood is not anxious or depressed.     Wt Readings from Last 3 Encounters:  09/14/20 156 lb (70.8 kg)  10/21/19 167 lb (75.8 kg)  07/01/19 172 lb (78 kg)    BP 116/80   Pulse 76   Ht 5\' 5"  (1.651 m)   Wt 156 lb (70.8 kg)   BMI 25.96 kg/m   Assessment and Plan: 1. Seasonal allergic rhinitis,  unspecified trigger Chronic.  Episodic.  Stable.  Patient will continue fluticasone nasal spray 2 puffs in each nostril daily along with over-the-counter nonsedating antihistamine Zyrtec. - fluticasone (FLONASE) 50 MCG/ACT nasal spray; Place 2 sprays into both nostrils daily.  Dispense: 16 g; Refill: 11  Patient will be calling her GYN for Pap smear and mammogram.

## 2020-09-15 ENCOUNTER — Telehealth: Payer: Self-pay

## 2020-09-15 NOTE — Telephone Encounter (Signed)
-----   Message from Fredderick Severance sent at 09/13/2020  9:13 AM EDT ----- Call and schedule for this month/ med refill- not seen since April of last year

## 2020-09-15 NOTE — Telephone Encounter (Signed)
Left message on voicemail to set up med refill appointment.

## 2020-09-22 ENCOUNTER — Ambulatory Visit: Payer: BC Managed Care – PPO | Admitting: Podiatry

## 2021-02-19 NOTE — Progress Notes (Signed)
Chief Complaint  Patient presents with   Gynecologic Exam    No concerns    HPI:      Ms. Erin Donovan is a 65 y.o. G2P1011 who LMP was No LMP recorded. Patient is postmenopausal., presents today for her NP> 3 yrs annual examination.  Her menses are absent due to menopause. She does not have PMB. She does not have vasomotor sx.   Sex activity: single partner, contraception - post menopausal status. She does have vaginal dryness. She uses lubricants with relief.   Last Pap: 11/05/16 Results were: no abnormalities /neg HPV DNA.   Last mammogram: 12/17/16  Results were: normal--routine follow-up in 12 months There is a FH of breast cancer in her sister, genetic testing not indicated. There is no FH ovarian cancer. The patient does do self-breast exams.  Colonoscopy: 12/08/16 with Dr. Allen Norris; with polyp, repeat after 10 yrs  Tobacco use: quit 2020 Alcohol use: none No drug use Exercise: minimally active   She does get adequate calcium and Vitamin D in her diet. She has her labs managed with her PCP.  Past Medical History:  Diagnosis Date   Arthritis, rheumatoid ([place]) 11/2013   Basal cell carcinoma of skin of face    "right side"   Chronic lumbar pain    "broke L1 last year" (09/17/2016)   Dental crowns present    2 implants - right upper - tooth 4 & 5   Depression    single episode   Family history of ovarian cancer 01/03/2014   genetic letter sent   History of mammogram 07/30/2010   neg/cat 2   Osteoarthritis    "hips" (09/17/2016)   Osteoporosis 2017   c compression fracture of spine   Osteosclerosis    Right hip pain    Weight gain     Past Surgical History:  Procedure Laterality Date   BASAL CELL CARCINOMA EXCISION  1990s   "right side of my face"   COLONOSCOPY WITH PROPOFOL N/A 12/05/2016   Procedure: COLONOSCOPY WITH PROPOFOL;  Surgeon: Lucilla Lame, MD;  Location: [facility];  Service: Endoscopy;  Laterality: N/A;   JOINT REPLACEMENT     POLYPECTOMY  N/A 12/05/2016   Procedure: POLYPECTOMY;  Surgeon: Lucilla Lame, MD;  Location: [facility];  Service: Endoscopy;  Laterality: N/A;   TOTAL HIP ARTHROPLASTY Right 09/17/2016   TOTAL HIP ARTHROPLASTY Right 09/17/2016   Procedure: RIGHT TOTAL HIP ARTHROPLASTY ANTERIOR APPROACH;  Surgeon: Renette Butters, MD;  Location: [facility];  Service: Orthopedics;  Laterality: Right;    Family History  Problem Relation Age of Onset   Stroke Sister 79   Cervical cancer Sister    Breast cancer Sister 32   Other Sister        Vulva Cancer   Diabetes Other    Hypertension Other    Osteoporosis Other     Social History   Socioeconomic History   Marital status: Married    Spouse name: Not on file   Number of children: Not on file   Years of education: Not on file   Highest education level: Not on file  Occupational History   Not on file  Tobacco Use   Smoking status: Former    Packs/day: 1.00    Years: 46.00    Pack years: 46.00    Types: Cigarettes    Quit date: 06/07/2019    Years since quitting: 1.7   Smokeless tobacco: Never   Tobacco comments:  startedage 16- gave info on patches and pills  Vaping Use   Vaping Use: Never used  Substance and Sexual Activity   Alcohol use: No   Drug use: No   Sexual activity: Yes    Birth control/protection: Post-menopausal  Other Topics Concern   Not on file  Social History Narrative   Not on file   Social Determinants of Health   Financial Resource Strain: Not on file  Food Insecurity: Not on file  Transportation Needs: Not on file  Physical Activity: Not on file  Stress: Not on file  Social Connections: Not on file  Intimate Partner Violence: Not on file     Current Outpatient Medications:    amoxicillin (AMOXIL) 500 MG capsule, TAKE 4 CAPSULES BY MOUTH 1 HOUR PRIOR TO DENTAL APPT, Disp: , Rfl:    cetirizine (ZYRTEC) 10 MG tablet, Take 10 mg by mouth as needed for allergies. otc, Disp: , Rfl:    Cholecalciferol (VITAMIN D3)  5000 units CAPS, Take 1 capsule (5,000 Units total) by mouth daily., Disp: 30 capsule, Rfl:    fluticasone (FLONASE) 50 MCG/ACT nasal spray, Place 2 sprays into both nostrils daily., Disp: 16 g, Rfl: 11   meloxicam (MOBIC) 15 MG tablet, Take 15 mg by mouth daily. [facility]/ ortho, Disp: , Rfl:    Potassium (POTASSIMIN PO), Take by mouth., Disp: , Rfl:    Zinc 50 MG CAPS, Take 1 capsule by mouth daily., Disp: , Rfl:    ROS:  Review of Systems  Constitutional:  Negative for fatigue, fever and unexpected weight change.  Respiratory:  Negative for cough, shortness of breath and wheezing.   Cardiovascular:  Negative for chest pain, palpitations and leg swelling.  Gastrointestinal:  Negative for blood in stool, constipation, diarrhea, nausea and vomiting.  Endocrine: Negative for cold intolerance, heat intolerance and polyuria.  Genitourinary:  Positive for dyspareunia. Negative for dysuria, flank pain, frequency, genital sores, hematuria, menstrual problem, pelvic pain, urgency, vaginal bleeding, vaginal discharge and vaginal pain.  Musculoskeletal:  Positive for arthralgias. Negative for back pain, joint swelling and myalgias.  Skin:  Negative for rash.  Neurological:  Negative for dizziness, syncope, light-headedness, numbness and headaches.  Hematological:  Negative for adenopathy.  Psychiatric/Behavioral:  Negative for agitation, confusion, sleep disturbance and suicidal ideas. The patient is not nervous/anxious.     Objective: BP 110/80   Ht '5\' 5"'$  (1.651 m)   Wt 166 lb (75.3 kg)   BMI 27.62 kg/m    Physical Exam Constitutional:      Appearance: She is well-developed.  Genitourinary:     Vulva and rectum normal.     Right Labia: No rash, tenderness or lesions.    Left Labia: No tenderness, lesions or rash.    No vaginal discharge, erythema or tenderness.      Right Adnexa: not tender and no mass present.    Left Adnexa: not tender and no mass present.    No cervical motion  tenderness, friability or polyp.     Uterus is not enlarged or tender.  Rectum:     Guaiac result negative.     No rectal mass or anal fissure.  Breasts:    Right: No mass, nipple discharge, skin change or tenderness.     Left: No mass, nipple discharge, skin change or tenderness.  Neck:     Thyroid: No thyromegaly.  Cardiovascular:     Rate and Rhythm: Normal rate and regular rhythm.     Heart sounds: Normal heart  sounds. No murmur heard. Pulmonary:     Effort: Pulmonary effort is normal.     Breath sounds: Normal breath sounds.  Abdominal:     Palpations: Abdomen is soft.     Tenderness: There is no abdominal tenderness. There is no guarding or rebound.  Musculoskeletal:        General: Normal range of motion.     Cervical back: Normal range of motion.  Lymphadenopathy:     Cervical: No cervical adenopathy.  Neurological:     General: No focal deficit present.     Mental Status: She is alert and oriented to person, place, and time.     Cranial Nerves: No cranial nerve deficit.  Skin:    General: Skin is warm and dry.  Psychiatric:        Mood and Affect: Mood normal.        Behavior: Behavior normal.        Thought Content: Thought content normal.        Judgment: Judgment normal.  Vitals reviewed.    Assessment/Plan:  Encounter for annual routine gynecological examination  Cervical cancer screening - Plan: Cytology - PAP  Screening for HPV (human papillomavirus) - Plan: Cytology - PAP  Encounter for screening mammogram for malignant neoplasm of breast - Plan: MM 3D SCREEN BREAST BILATERAL; pt to sched mammo         GYN counsel mammography screening, adequate intake of calcium and vitamin D     F/U  Return in about 1 year (around 02/20/2022).  Awad Gladd B. Macai Sisneros, PA-C 02/20/2021 10:38 AM

## 2021-02-20 ENCOUNTER — Other Ambulatory Visit: Payer: Self-pay

## 2021-02-20 ENCOUNTER — Ambulatory Visit (INDEPENDENT_AMBULATORY_CARE_PROVIDER_SITE_OTHER): Payer: BC Managed Care – PPO | Admitting: Obstetrics and Gynecology

## 2021-02-20 ENCOUNTER — Other Ambulatory Visit (HOSPITAL_COMMUNITY)
Admission: RE | Admit: 2021-02-20 | Discharge: 2021-02-20 | Disposition: A | Discharge status: Home / Self Care | Payer: BC Managed Care – PPO | Source: Ambulatory Visit | Attending: Obstetrics and Gynecology | Admitting: Obstetrics and Gynecology

## 2021-02-20 ENCOUNTER — Encounter: Payer: Self-pay | Admitting: Obstetrics and Gynecology

## 2021-02-20 VITALS — BP 110/80 | Ht 65.0 in | Wt 166.0 lb

## 2021-02-20 DIAGNOSIS — Z1151 Encounter for screening for human papillomavirus (HPV): Secondary | ICD-10-CM

## 2021-02-20 DIAGNOSIS — Z124 Encounter for screening for malignant neoplasm of cervix: Secondary | ICD-10-CM | POA: Insufficient documentation

## 2021-02-20 DIAGNOSIS — Z1231 Encounter for screening mammogram for malignant neoplasm of breast: Secondary | ICD-10-CM | POA: Diagnosis not present

## 2021-02-20 DIAGNOSIS — Z01419 Encounter for gynecological examination (general) (routine) without abnormal findings: Secondary | ICD-10-CM

## 2021-02-20 NOTE — Patient Instructions (Addendum)
I value your feedback and you entrusting us with your care. If you get a [facility] patient survey, I would appreciate you taking the time to let us know about your experience today. Thank you!  [facility]: [phone]      

## 2021-02-21 LAB — CYTOLOGY - PAP
Comment: NEGATIVE
Diagnosis: NEGATIVE
High risk HPV: NEGATIVE

## 2021-03-08 ENCOUNTER — Ambulatory Visit
Admission: RE | Admit: 2021-03-08 | Discharge: 2021-03-08 | Disposition: A | Discharge status: Home / Self Care | Payer: BC Managed Care – PPO | Source: Ambulatory Visit | Attending: Obstetrics and Gynecology | Admitting: Obstetrics and Gynecology

## 2021-03-08 ENCOUNTER — Other Ambulatory Visit: Payer: Self-pay

## 2021-03-08 DIAGNOSIS — Z1231 Encounter for screening mammogram for malignant neoplasm of breast: Secondary | ICD-10-CM

## 2021-03-12 ENCOUNTER — Other Ambulatory Visit: Payer: Self-pay | Admitting: Obstetrics and Gynecology

## 2021-03-12 DIAGNOSIS — R928 Other abnormal and inconclusive findings on diagnostic imaging of breast: Secondary | ICD-10-CM

## 2021-03-12 DIAGNOSIS — N6320 Unspecified lump in the left breast, unspecified quadrant: Secondary | ICD-10-CM

## 2021-03-15 ENCOUNTER — Other Ambulatory Visit: Payer: Self-pay

## 2021-03-15 ENCOUNTER — Ambulatory Visit
Admission: RE | Admit: 2021-03-15 | Discharge: 2021-03-15 | Disposition: A | Discharge status: Home / Self Care | Payer: BC Managed Care – PPO | Source: Ambulatory Visit | Attending: Obstetrics and Gynecology | Admitting: Obstetrics and Gynecology

## 2021-03-15 DIAGNOSIS — R928 Other abnormal and inconclusive findings on diagnostic imaging of breast: Secondary | ICD-10-CM

## 2021-03-15 DIAGNOSIS — N6012 Diffuse cystic mastopathy of left breast: Secondary | ICD-10-CM | POA: Diagnosis not present

## 2021-03-15 DIAGNOSIS — N6320 Unspecified lump in the left breast, unspecified quadrant: Secondary | ICD-10-CM | POA: Diagnosis not present

## 2021-03-16 ENCOUNTER — Encounter: Payer: Self-pay | Admitting: Obstetrics and Gynecology

## 2021-05-31 ENCOUNTER — Ambulatory Visit: Payer: BC Managed Care – PPO | Admitting: Family Medicine

## 2021-06-07 ENCOUNTER — Ambulatory Visit: Payer: BC Managed Care – PPO | Admitting: Family Medicine

## 2021-06-07 ENCOUNTER — Other Ambulatory Visit: Payer: Self-pay

## 2021-06-07 ENCOUNTER — Encounter: Payer: Self-pay | Admitting: Family Medicine

## 2021-06-07 VITALS — BP 120/80 | HR 64 | Ht 65.0 in | Wt 173.0 lb

## 2021-06-07 DIAGNOSIS — J302 Other seasonal allergic rhinitis: Secondary | ICD-10-CM | POA: Diagnosis not present

## 2021-06-07 DIAGNOSIS — Z23 Encounter for immunization: Secondary | ICD-10-CM

## 2021-06-07 MED ORDER — FLUTICASONE PROPIONATE 50 MCG/ACT NA SUSP: 2.0000 | Freq: Every day | NASAL | 11 refills | Status: DC

## 2021-06-07 NOTE — Progress Notes (Signed)
Date:  06/07/2021   Name:  Erin Donovan   DOB:  05/20/1956   MRN:  [id number]   Chief Complaint: Allergic Rhinitis  and Flu Vaccine  URI  This is a chronic (allergic rhinitis) problem. The current episode started more than 1 year ago. The problem has been gradually improving. There has been no fever. Associated symptoms include congestion and rhinorrhea. Pertinent negatives include no abdominal pain, chest pain, coughing, diarrhea, dysuria, ear pain, headaches, nausea, neck pain, rash, sore throat or wheezing. Treatments tried: flonase. The treatment provided moderate relief.   Lab Results  Component Value Date   NA 139 10/21/2019   K 4.6 10/21/2019   CO2 23 10/21/2019   GLUCOSE 77 10/21/2019   BUN 25 10/21/2019   CREATININE 0.83 10/21/2019   CALCIUM 9.3 10/21/2019   GFRNONAA 76 10/21/2019   Lab Results  Component Value Date   CHOL 189 10/21/2019   HDL 70 10/21/2019   LDLCALC 111 (H) 10/21/2019   TRIG 43 10/21/2019   CHOLHDL 3.4 07/31/2016   Lab Results  Component Value Date   TSH 0.627 07/31/2016   Lab Results  Component Value Date   HGBA1C 5.3 10/21/2019   Lab Results  Component Value Date   WBC 6.3 10/21/2019   HGB 13.5 10/21/2019   HCT 41.2 10/21/2019   MCV 91 10/21/2019   PLT 245 10/21/2019   Lab Results  Component Value Date   ALT 15 07/31/2016   AST 18 07/31/2016   ALKPHOS 117 07/31/2016   BILITOT 0.3 07/31/2016   Lab Results  Component Value Date   VD25OH 57.7 09/02/2017     Review of Systems  Constitutional:  Negative for chills and fever.  HENT:  Positive for congestion and rhinorrhea. Negative for drooling, ear discharge, ear pain and sore throat.   Respiratory:  Negative for cough, shortness of breath and wheezing.   Cardiovascular:  Negative for chest pain, palpitations and leg swelling.  Gastrointestinal:  Negative for abdominal pain, blood in stool, constipation, diarrhea and nausea.  Endocrine: Negative for polydipsia.   Genitourinary:  Negative for dysuria, frequency, hematuria and urgency.  Musculoskeletal:  Negative for back pain, myalgias and neck pain.  Skin:  Negative for rash.  Allergic/Immunologic: Negative for environmental allergies.  Neurological:  Negative for dizziness and headaches.  Hematological:  Does not bruise/bleed easily.  Psychiatric/Behavioral:  Negative for suicidal ideas. The patient is not nervous/anxious.    Patient Active Problem List   Diagnosis Date Noted   Current use of proton pump inhibitor 09/02/2017   Special screening for malignant neoplasms, colon    Polyp of sigmoid colon    Osteoarthritis 09/17/2016   Allergic rhinitis 08/27/2016   Age-related osteoporosis without current pathological fracture 07/31/2016   Hx of compression fracture of spine 07/31/2016   Obesity (BMI 30.0-34.9) 07/31/2016   Smoker 07/31/2016    Allergies  Allergen Reactions   No Known Allergies     Past Surgical History:  Procedure Laterality Date   BASAL CELL CARCINOMA EXCISION  1990s   "right side of my face"   COLONOSCOPY WITH PROPOFOL N/A 12/05/2016   Procedure: COLONOSCOPY WITH PROPOFOL;  Surgeon: Lucilla Lame, MD;  Location: [facility];  Service: Endoscopy;  Laterality: N/A;   JOINT REPLACEMENT     POLYPECTOMY N/A 12/05/2016   Procedure: POLYPECTOMY;  Surgeon: Lucilla Lame, MD;  Location: [facility];  Service: Endoscopy;  Laterality: N/A;   TOTAL HIP ARTHROPLASTY Right 09/17/2016   TOTAL HIP  ARTHROPLASTY Right 09/17/2016   Procedure: RIGHT TOTAL HIP ARTHROPLASTY ANTERIOR APPROACH;  Surgeon: Renette Butters, MD;  Location: [facility];  Service: Orthopedics;  Laterality: Right;    Social History   Tobacco Use   Smoking status: Former    Packs/day: 1.00    Years: 46.00    Pack years: 46.00    Types: Cigarettes    Quit date: 06/07/2019    Years since quitting: 2.0   Smokeless tobacco: Never   Tobacco comments:    startedage 16- gave info on patches and pills   Vaping Use   Vaping Use: Never used  Substance Use Topics   Alcohol use: No   Drug use: No     Medication list has been reviewed and updated.  Current Meds  Medication Sig   cetirizine (ZYRTEC) 10 MG tablet Take 10 mg by mouth as needed for allergies. otc   Cholecalciferol (VITAMIN D3) 5000 units CAPS Take 1 capsule (5,000 Units total) by mouth daily.   fluticasone (FLONASE) 50 MCG/ACT nasal spray Place 2 sprays into both nostrils daily.   magnesium oxide (MAG-OX) 400 MG tablet Take 400 mg by mouth daily.   meloxicam (MOBIC) 15 MG tablet Take 15 mg by mouth daily. [facility]/ ortho   Zinc 50 MG CAPS Take 1 capsule by mouth daily.   [DISCONTINUED] Potassium (POTASSIMIN PO) Take by mouth.    PHQ 2/9 Scores 06/07/2021 09/14/2020 10/21/2019 12/28/2018  PHQ - 2 Score 0 0 0 0  PHQ- 9 Score 0 0 0 0    GAD 7 : Generalized Anxiety Score 06/07/2021 09/14/2020 10/21/2019  Nervous, Anxious, on Edge 0 0 0  Control/stop worrying 0 0 0  Worry too much - different things 0 0 0  Trouble relaxing 0 0 0  Restless 0 0 0  Easily annoyed or irritable 0 0 0  Afraid - awful might happen 0 0 0  Total GAD 7 Score 0 0 0  Anxiety Difficulty - - Not difficult at all    BP Readings from Last 3 Encounters:  06/07/21 120/80  02/20/21 110/80  09/14/20 116/80    Physical Exam Vitals and nursing note reviewed.  Constitutional:      General: She is not in acute distress.    Appearance: She is not diaphoretic.  HENT:     Head: Normocephalic and atraumatic.     Jaw: There is normal jaw occlusion.     Right Ear: Hearing, tympanic membrane and external ear normal.     Left Ear: Hearing, tympanic membrane and external ear normal.     Nose: Nose normal.     Right Turbinates: Not enlarged or swollen.     Left Turbinates: Not enlarged or swollen.  Eyes:     General:        Right eye: No discharge.        Left eye: No discharge.     Conjunctiva/sclera: Conjunctivae normal.     Pupils: Pupils are equal,  round, and reactive to light.  Neck:     Thyroid: No thyromegaly.     Vascular: No JVD.  Cardiovascular:     Rate and Rhythm: Normal rate and regular rhythm.     Heart sounds: Normal heart sounds, S1 normal and S2 normal. No murmur heard.   No friction rub. No gallop. No S3 or S4 sounds.  Pulmonary:     Effort: Pulmonary effort is normal.     Breath sounds: Normal breath sounds.  Abdominal:  General: Bowel sounds are normal.     Palpations: Abdomen is soft. There is no mass.     Tenderness: There is no abdominal tenderness. There is no guarding.  Musculoskeletal:        General: Normal range of motion.     Cervical back: Normal range of motion and neck supple.  Lymphadenopathy:     Cervical: No cervical adenopathy.  Skin:    General: Skin is warm and dry.  Neurological:     Mental Status: She is alert.     Deep Tendon Reflexes: Reflexes are normal and symmetric.    Wt Readings from Last 3 Encounters:  06/07/21 173 lb (78.5 kg)  02/20/21 166 lb (75.3 kg)  09/14/20 156 lb (70.8 kg)    BP 120/80   Pulse 64   Ht 5\' 5"  (1.651 m)   Wt 173 lb (78.5 kg)   BMI 28.79 kg/m   Assessment and Plan: Immunizations are reviewed and recommendations provided.   Age appropriate screening tests are discussed. Counseling given for risk factor reduction interventions.  1. Seasonal allergic rhinitis, unspecified trigger Chronic.  Recurrent.  Seasonal.  Currently stable.  Refilled patient's fluticasone nasal spray 2 sprays in both nostrils daily. - fluticasone (FLONASE) 50 MCG/ACT nasal spray; Place 2 sprays into both nostrils daily.  Dispense: 16 g; Refill: 11  2. Need for immunization against influenza Discussed and administered - Flu Vaccine QUAD 5mo+IM (Fluarix, Fluzone & Alfiuria Quad PF)

## 2021-06-07 NOTE — Patient Instructions (Signed)

## 2021-06-18 ENCOUNTER — Ambulatory Visit: Payer: Self-pay | Admitting: *Deleted

## 2021-06-18 NOTE — Telephone Encounter (Signed)
Chief Complaint: Covid positive today Symptoms: Cough, congestion, body aches, fatigue, LGT 99.4, headache Frequency: Symptoms onset yesterday Pertinent Negatives: Patient denies SOB, sore throat, wheezing Disposition: [] ED /[] Urgent Care (no appt availability in office) / [] Appointment(In office/virtual)/ []  [facility]/ [x] Home Care/ [] Refused Recommended Disposition  Additional Notes: Home care advise given as well as guidelines for self isolation. Reviewed symptoms that warrant an ED eval. Pt verbalizes understanding. Pt is interested in oral anti viral if Dr. Ronnald Ramp thinks appropriate.  CB# [phone]       Reason for Disposition  [1] COVID-19 diagnosed by positive lab test (e.g., PCR, rapid self-test kit) AND [2] mild symptoms (e.g., cough, fever, others) AND [4] no complications or SOB  Answer Assessment - Initial Assessment Questions 1. COVID-19 DIAGNOSIS: "Who made your COVID-19 diagnosis?" "Was it confirmed by a positive lab test or self-test?" If not diagnosed by a doctor (or NP/PA), ask "Are there lots of cases (community spread) where you live?" Note: See public health department website, if unsure.     Home test today 2. COVID-19 EXPOSURE: "Was there any known exposure to COVID before the symptoms began?" CDC Definition of close contact: within 6 feet (2 meters) for a total of 15 minutes or more over a 24-hour period.      No 3. ONSET: "When did the COVID-19 symptoms start?"      Yesterday 4. WORST SYMPTOM: "What is your worst symptom?" (e.g., cough, fever, shortness of breath, muscle aches)     *No Answer* 5. COUGH: "Do you have a cough?" If Yes, ask: "How bad is the cough?"       Yes, mostly dry. When productive yellowish 6. FEVER: "Do you have a fever?" If Yes, ask: "What is your temperature, how was it measured, and when did it start?"     Chills. 99.7 7. RESPIRATORY STATUS: "Describe your breathing?" (e.g., shortness of breath, wheezing, unable to  speak)      WNL 8. BETTER-SAME-WORSE: "Are you getting better, staying the same or getting worse compared to yesterday?"  If getting worse, ask, "In what way?"     worse 9. HIGH RISK DISEASE: "Do you have any chronic medical problems?" (e.g., asthma, heart or lung disease, weak immune system, obesity, etc.)     *No Answer* 10. VACCINE: "Have you had the COVID-19 vaccine?" If Yes, ask: "Which one, how many shots, when did you get it?"       1 vaccine, June J and J 11. BOOSTER: "Have you received your COVID-19 booster?" If Yes, ask: "Which one and when did you get it?"       No  13. OTHER SYMPTOMS: "Do you have any other symptoms?"  (e.g., chills, fatigue, headache, loss of smell or taste, muscle pain, sore throat)       Fatigue, headache, "Major body aches" 14. O2 SATURATION MONITOR:  "Do you use an oxygen saturation monitor (pulse oximeter) at home?" If Yes, ask "What is your reading (oxygen level) today?" "What is your usual oxygen saturation reading?" (e.g., 95%)       *No Answer*  Protocols used: Coronavirus (COVID-19) Diagnosed or Suspected-A-AH

## 2021-06-18 NOTE — Telephone Encounter (Signed)
Summary: chills and congestion   Patient has chills , and congestion , dx with covid today. She is asking for meds to be sent to [facility], [address] AT [facility]  [place] [address]  Phone: [phone] Fax: [phone]     Attempted to call patient - left message on voice mail to call back.

## 2022-02-06 DIAGNOSIS — M545 Low back pain, unspecified: Secondary | ICD-10-CM | POA: Diagnosis not present

## 2022-02-06 DIAGNOSIS — M25562 Pain in left knee: Secondary | ICD-10-CM | POA: Diagnosis not present

## 2022-02-14 DIAGNOSIS — M6281 Muscle weakness (generalized): Secondary | ICD-10-CM | POA: Diagnosis not present

## 2022-02-14 DIAGNOSIS — R269 Unspecified abnormalities of gait and mobility: Secondary | ICD-10-CM | POA: Diagnosis not present

## 2022-02-14 DIAGNOSIS — M48061 Spinal stenosis, lumbar region without neurogenic claudication: Secondary | ICD-10-CM | POA: Diagnosis not present

## 2022-02-19 DIAGNOSIS — M6281 Muscle weakness (generalized): Secondary | ICD-10-CM | POA: Diagnosis not present

## 2022-02-19 DIAGNOSIS — R269 Unspecified abnormalities of gait and mobility: Secondary | ICD-10-CM | POA: Diagnosis not present

## 2022-02-19 DIAGNOSIS — M48061 Spinal stenosis, lumbar region without neurogenic claudication: Secondary | ICD-10-CM | POA: Diagnosis not present

## 2022-02-28 DIAGNOSIS — M48061 Spinal stenosis, lumbar region without neurogenic claudication: Secondary | ICD-10-CM | POA: Diagnosis not present

## 2022-02-28 DIAGNOSIS — R269 Unspecified abnormalities of gait and mobility: Secondary | ICD-10-CM | POA: Diagnosis not present

## 2022-02-28 DIAGNOSIS — M6281 Muscle weakness (generalized): Secondary | ICD-10-CM | POA: Diagnosis not present

## 2022-03-12 DIAGNOSIS — M6281 Muscle weakness (generalized): Secondary | ICD-10-CM | POA: Diagnosis not present

## 2022-03-12 DIAGNOSIS — M48061 Spinal stenosis, lumbar region without neurogenic claudication: Secondary | ICD-10-CM | POA: Diagnosis not present

## 2022-03-12 DIAGNOSIS — R269 Unspecified abnormalities of gait and mobility: Secondary | ICD-10-CM | POA: Diagnosis not present

## 2022-03-19 DIAGNOSIS — R269 Unspecified abnormalities of gait and mobility: Secondary | ICD-10-CM | POA: Diagnosis not present

## 2022-03-19 DIAGNOSIS — M48061 Spinal stenosis, lumbar region without neurogenic claudication: Secondary | ICD-10-CM | POA: Diagnosis not present

## 2022-03-19 DIAGNOSIS — M6281 Muscle weakness (generalized): Secondary | ICD-10-CM | POA: Diagnosis not present

## 2022-03-20 DIAGNOSIS — M48061 Spinal stenosis, lumbar region without neurogenic claudication: Secondary | ICD-10-CM | POA: Diagnosis not present

## 2022-04-02 DIAGNOSIS — H5203 Hypermetropia, bilateral: Secondary | ICD-10-CM | POA: Diagnosis not present

## 2022-04-02 DIAGNOSIS — H524 Presbyopia: Secondary | ICD-10-CM | POA: Diagnosis not present

## 2022-04-02 DIAGNOSIS — H52223 Regular astigmatism, bilateral: Secondary | ICD-10-CM | POA: Diagnosis not present

## 2022-04-02 DIAGNOSIS — H2513 Age-related nuclear cataract, bilateral: Secondary | ICD-10-CM | POA: Diagnosis not present

## 2022-04-15 ENCOUNTER — Other Ambulatory Visit: Payer: Self-pay | Admitting: Family Medicine

## 2022-04-15 ENCOUNTER — Other Ambulatory Visit: Payer: Self-pay | Admitting: Obstetrics and Gynecology

## 2022-04-15 DIAGNOSIS — Z1231 Encounter for screening mammogram for malignant neoplasm of breast: Secondary | ICD-10-CM

## 2022-06-13 ENCOUNTER — Ambulatory Visit: Payer: BC Managed Care – PPO | Admitting: Family Medicine

## 2022-06-17 ENCOUNTER — Ambulatory Visit (INDEPENDENT_AMBULATORY_CARE_PROVIDER_SITE_OTHER): Payer: Medicare Other | Admitting: Family Medicine

## 2022-06-17 ENCOUNTER — Encounter: Payer: Self-pay | Admitting: Family Medicine

## 2022-06-17 VITALS — BP 129/79 | HR 79 | Ht 64.5 in | Wt 181.0 lb

## 2022-06-17 DIAGNOSIS — M8580 Other specified disorders of bone density and structure, unspecified site: Secondary | ICD-10-CM

## 2022-06-17 DIAGNOSIS — J302 Other seasonal allergic rhinitis: Secondary | ICD-10-CM | POA: Diagnosis not present

## 2022-06-17 DIAGNOSIS — Z780 Asymptomatic menopausal state: Secondary | ICD-10-CM

## 2022-06-17 DIAGNOSIS — Z122 Encounter for screening for malignant neoplasm of respiratory organs: Secondary | ICD-10-CM | POA: Diagnosis not present

## 2022-06-17 DIAGNOSIS — E78019 Familial hypercholesterolemia, unspecified: Secondary | ICD-10-CM

## 2022-06-17 DIAGNOSIS — E7801 Familial hypercholesterolemia: Secondary | ICD-10-CM

## 2022-06-17 MED ORDER — FLUTICASONE PROPIONATE 50 MCG/ACT NA SUSP: 2.0000 | Freq: Every day | NASAL | 11 refills | Status: DC

## 2022-06-17 MED ORDER — CETIRIZINE HCL 10 MG PO TABS: 10.0000 mg | ORAL_TABLET | ORAL | 3 refills | Status: DC | PRN

## 2022-06-17 NOTE — Progress Notes (Signed)
Date:  06/17/2022   Name:  Erin Donovan   DOB:  February 07, 1956   MRN:  [id number]   Chief Complaint: Allergies and Immunizations (FLU vaccine/)  URI  This is a recurrent problem. The current episode started more than 1 year ago. The problem has been waxing and waning. There has been no fever. Associated symptoms include congestion, coughing, rhinorrhea and sinus pain. Pertinent negatives include no abdominal pain, chest pain, diarrhea, dysuria, ear pain, headaches, plugged ear sensation, sneezing, sore throat, swollen glands or wheezing.    Lab Results  Component Value Date   NA 139 10/21/2019   K 4.6 10/21/2019   CO2 23 10/21/2019   GLUCOSE 77 10/21/2019   BUN 25 10/21/2019   CREATININE 0.83 10/21/2019   CALCIUM 9.3 10/21/2019   GFRNONAA 76 10/21/2019   Lab Results  Component Value Date   CHOL 189 10/21/2019   HDL 70 10/21/2019   LDLCALC 111 (H) 10/21/2019   TRIG 43 10/21/2019   CHOLHDL 3.4 07/31/2016   Lab Results  Component Value Date   TSH 0.627 07/31/2016   Lab Results  Component Value Date   HGBA1C 5.3 10/21/2019   Lab Results  Component Value Date   WBC 6.3 10/21/2019   HGB 13.5 10/21/2019   HCT 41.2 10/21/2019   MCV 91 10/21/2019   PLT 245 10/21/2019   Lab Results  Component Value Date   ALT 15 07/31/2016   AST 18 07/31/2016   ALKPHOS 117 07/31/2016   BILITOT 0.3 07/31/2016   Lab Results  Component Value Date   VD25OH 57.7 09/02/2017     Review of Systems  HENT:  Positive for congestion, postnasal drip, rhinorrhea, sinus pressure and sinus pain. Negative for ear pain, sneezing and sore throat.   Respiratory:  Positive for cough. Negative for choking, chest tightness, shortness of breath and wheezing.   Cardiovascular:  Negative for chest pain and palpitations.  Gastrointestinal:  Negative for abdominal pain and diarrhea.  Genitourinary:  Negative for dysuria.  Neurological:  Negative for headaches.    Patient Active Problem List    Diagnosis Date Noted   Current use of proton pump inhibitor 09/02/2017   Special screening for malignant neoplasms, colon    Polyp of sigmoid colon    Osteoarthritis 09/17/2016   Allergic rhinitis 08/27/2016   Age-related osteoporosis without current pathological fracture 07/31/2016   Hx of compression fracture of spine 07/31/2016   Obesity (BMI 30.0-34.9) 07/31/2016   Smoker 07/31/2016    Allergies  Allergen Reactions   No Known Allergies     Past Surgical History:  Procedure Laterality Date   BASAL CELL CARCINOMA EXCISION  1990s   "right side of my face"   COLONOSCOPY WITH PROPOFOL N/A 12/05/2016   Procedure: COLONOSCOPY WITH PROPOFOL;  Surgeon: Lucilla Lame, MD;  Location: [place];  Service: Endoscopy;  Laterality: N/A;   JOINT REPLACEMENT     POLYPECTOMY N/A 12/05/2016   Procedure: POLYPECTOMY;  Surgeon: Lucilla Lame, MD;  Location: [facility];  Service: Endoscopy;  Laterality: N/A;   TOTAL HIP ARTHROPLASTY Right 09/17/2016   TOTAL HIP ARTHROPLASTY Right 09/17/2016   Procedure: RIGHT TOTAL HIP ARTHROPLASTY ANTERIOR APPROACH;  Surgeon: Renette Butters, MD;  Location: [facility];  Service: Orthopedics;  Laterality: Right;    Social History   Tobacco Use   Smoking status: Former    Packs/day: 1.00    Years: 46.00    Total pack years: 46.00    Types: Cigarettes  Quit date: 06/07/2019    Years since quitting: 3.0   Smokeless tobacco: Never   Tobacco comments:    startedage 16- gave info on patches and pills  Vaping Use   Vaping Use: Never used  Substance Use Topics   Alcohol use: No   Drug use: No     Medication list has been reviewed and updated.  Current Meds  Medication Sig   amoxicillin (AMOXIL) 500 MG capsule    cetirizine (ZYRTEC) 10 MG tablet Take 10 mg by mouth as needed for allergies. otc   Cholecalciferol (VITAMIN D3) 5000 units CAPS Take 1 capsule (5,000 Units total) by mouth daily.   fluticasone (FLONASE) 50 MCG/ACT nasal spray Place  2 sprays into both nostrils daily.   magnesium oxide (MAG-OX) 400 MG tablet Take 400 mg by mouth daily.   meloxicam (MOBIC) 15 MG tablet Take 15 mg by mouth daily. [facility]/ ortho   Zinc 50 MG CAPS Take 1 capsule by mouth daily.       06/17/2022    3:40 PM 06/07/2021    8:33 AM 09/14/2020    8:24 AM 10/21/2019    9:26 AM  GAD 7 : Generalized Anxiety Score  Nervous, Anxious, on Edge 0 0 0 0  Control/stop worrying 0 0 0 0  Worry too much - different things 0 0 0 0  Trouble relaxing 0 0 0 0  Restless 0 0 0 0  Easily annoyed or irritable 0 0 0 0  Afraid - awful might happen 0 0 0 0  Total GAD 7 Score 0 0 0 0  Anxiety Difficulty Not difficult at all   Not difficult at all       06/17/2022    3:40 PM 06/07/2021    8:33 AM 09/14/2020    8:24 AM  Depression screen PHQ 2/9  Decreased Interest 0 0 0  Down, Depressed, Hopeless 0 0 0  PHQ - 2 Score 0 0 0  Altered sleeping 0 0 0  Tired, decreased energy 0 0 0  Change in appetite 0 0 0  Feeling bad or failure about yourself  0 0 0  Trouble concentrating 0 0 0  Moving slowly or fidgety/restless 0 0 0  Suicidal thoughts 0 0 0  PHQ-9 Score 0 0 0  Difficult doing work/chores Not difficult at all      BP Readings from Last 3 Encounters:  06/17/22 129/79  06/07/21 120/80  02/20/21 110/80    Physical Exam Vitals and nursing note reviewed. Exam conducted with a chaperone present.  Constitutional:      General: She is not in acute distress.    Appearance: She is not diaphoretic.  HENT:     Head: Normocephalic and atraumatic.     Right Ear: External ear normal.     Left Ear: External ear normal.     Nose: Nose normal.  Eyes:     General:        Right eye: No discharge.        Left eye: No discharge.     Conjunctiva/sclera: Conjunctivae normal.     Pupils: Pupils are equal, round, and reactive to light.  Neck:     Thyroid: No thyromegaly.     Vascular: No JVD.  Cardiovascular:     Rate and Rhythm: Normal rate and regular rhythm.      Heart sounds: Normal heart sounds. No murmur heard.    No friction rub. No gallop.  Pulmonary:  Effort: Pulmonary effort is normal.     Breath sounds: Normal breath sounds.  Abdominal:     General: Bowel sounds are normal.     Palpations: Abdomen is soft. There is no mass.     Tenderness: There is no abdominal tenderness. There is no guarding.  Musculoskeletal:        General: Normal range of motion.     Cervical back: Normal range of motion and neck supple.  Lymphadenopathy:     Cervical: No cervical adenopathy.  Skin:    General: Skin is warm and dry.  Neurological:     Mental Status: She is alert.     Deep Tendon Reflexes: Reflexes are normal and symmetric.     Wt Readings from Last 3 Encounters:  06/17/22 181 lb (82.1 kg)  06/07/21 173 lb (78.5 kg)  02/20/21 166 lb (75.3 kg)    BP 129/79   Pulse 79   Ht 5' 4.5" (1.638 m)   Wt 181 lb (82.1 kg)   SpO2 95%   BMI 30.59 kg/m   Assessment and Plan:  1. Seasonal allergic rhinitis, unspecified trigger Chronic.  Episodic.  Stable.  Controlled with Zyrtec 10 mg daily and Flonase on a daily basis on an as needed. - cetirizine (ZYRTEC) 10 MG tablet; Take 1 tablet (10 mg total) by mouth as needed for allergies. otc  Dispense: 90 tablet; Refill: 3 - fluticasone (FLONASE) 50 MCG/ACT nasal spray; Place 2 sprays into both nostrils daily.  Dispense: 16 g; Refill: 11  2. Encounter for screening for lung cancer Patient has likely a 40-pack-year history of smoking and has only been quit for 3 years.  She would like screening for lung cancer and we will pursue the possibility. - Ambulatory Referral for Lung Cancer Scre  3. Familial hypercholesterolemia Chronic.  Controlled.  Stable.  Will check lipid panel and likely control with diet. - Lipid Panel With LDL/HDL Ratio - Comprehensive Metabolic Panel (CMET)  4. Osteopenia after menopause Patient has a history of osteopenia which she is would like to be further evaluated as  that being greater than 3 years since her bone density. - DG Bone Density    Otilio Miu, MD

## 2022-06-25 ENCOUNTER — Telehealth: Payer: Self-pay

## 2022-06-25 DIAGNOSIS — E7801 Familial hypercholesterolemia: Secondary | ICD-10-CM | POA: Diagnosis not present

## 2022-06-25 NOTE — Telephone Encounter (Signed)
Called pt and left message to go to lab and have the labs drawn that were ordered on her at last visit.

## 2022-06-26 LAB — LIPID PANEL WITH LDL/HDL RATIO
Cholesterol, Total: 160 mg/dL (ref 100–199)
HDL: 58 mg/dL (ref >39)
LDL Chol Calc (NIH): 88 mg/dL (ref 0–99)
LDL/HDL Ratio: 1.5 ratio (ref 0.0–3.2)
Triglycerides: 72 mg/dL (ref 0–149)
VLDL Cholesterol Cal: 14 mg/dL (ref 5–40)

## 2022-06-26 LAB — COMPREHENSIVE METABOLIC PANEL
ALT: 12 IU/L (ref 0–32)
AST: 12 IU/L (ref 0–40)
Albumin/Globulin Ratio: 2 (ref 1.2–2.2)
Albumin: 4.1 g/dL (ref 3.9–4.9)
Alkaline Phosphatase: 83 IU/L (ref 44–121)
BUN/Creatinine Ratio: 14 (ref 12–28)
BUN: 15 mg/dL (ref 8–27)
Bilirubin Total: 0.3 mg/dL (ref 0.0–1.2)
CO2: 24 mmol/L (ref 20–29)
Calcium: 8.7 mg/dL (ref 8.7–10.3)
Chloride: 104 mmol/L (ref 96–106)
Creatinine, Ser: 1.1 mg/dL — ABNORMAL HIGH (ref 0.57–1.00)
Globulin, Total: 2.1 g/dL (ref 1.5–4.5)
Glucose: 110 mg/dL — ABNORMAL HIGH (ref 70–99)
Potassium: 4.1 mmol/L (ref 3.5–5.2)
Sodium: 140 mmol/L (ref 134–144)
Total Protein: 6.2 g/dL (ref 6.0–8.5)
eGFR: 56 mL/min/{1.73_m2} — ABNORMAL LOW (ref >59)

## 2022-07-03 ENCOUNTER — Ambulatory Visit
Admission: RE | Admit: 2022-07-03 | Discharge: 2022-07-03 | Disposition: A | Discharge status: Home / Self Care | Payer: BLUE CROSS/BLUE SHIELD | Source: Ambulatory Visit | Attending: Family Medicine | Admitting: Family Medicine

## 2022-07-03 DIAGNOSIS — Z1231 Encounter for screening mammogram for malignant neoplasm of breast: Secondary | ICD-10-CM | POA: Insufficient documentation

## 2022-08-29 ENCOUNTER — Telehealth: Payer: Self-pay | Admitting: Family Medicine

## 2022-08-29 NOTE — Telephone Encounter (Signed)
Contacted Erin Donovan to schedule their annual wellness visit. Appointment made for 09/17/2022.  Sherol Dade; Care Guide Ambulatory Clinical [facility] Direct Dial: [phone]

## 2022-09-17 ENCOUNTER — Ambulatory Visit: Payer: Medicare Other

## 2022-09-19 ENCOUNTER — Telehealth: Payer: Self-pay | Admitting: Family Medicine

## 2022-09-19 NOTE — Telephone Encounter (Signed)
Contacted Debbora A Skowronek to schedule their annual wellness visit. Welcome to Medicare visit Due by 11/2022.  Sherol Dade; Care Guide Ambulatory Clinical [facility] Direct Dial: [phone]

## 2022-10-09 DIAGNOSIS — K080 Exfoliation of teeth due to systemic causes: Secondary | ICD-10-CM | POA: Diagnosis not present

## 2022-11-12 ENCOUNTER — Ambulatory Visit
Admission: RE | Admit: 2022-11-12 | Discharge: 2022-11-12 | Disposition: A | Discharge status: Home / Self Care | Payer: Medicare Other | Source: Ambulatory Visit | Attending: Family Medicine | Admitting: Family Medicine

## 2022-11-12 DIAGNOSIS — M8580 Other specified disorders of bone density and structure, unspecified site: Secondary | ICD-10-CM | POA: Diagnosis not present

## 2022-11-12 DIAGNOSIS — M810 Age-related osteoporosis without current pathological fracture: Secondary | ICD-10-CM | POA: Diagnosis not present

## 2022-11-12 DIAGNOSIS — Z780 Asymptomatic menopausal state: Secondary | ICD-10-CM | POA: Diagnosis not present

## 2022-11-20 ENCOUNTER — Telehealth: Payer: Self-pay | Admitting: Family Medicine

## 2022-11-20 NOTE — Telephone Encounter (Signed)
Copied from CRM [id number]. Topic: Medicare AWV >> Nov 20, 2022 11:25 AM Payton Doughty wrote: Reason for CRM: Called patient to schedule Medicare Annual Wellness Visit (AWV). Left message for patient to call back and schedule Medicare Annual Wellness Visit (AWV).    Please schedule an appointment at any time with Kennedy Bucker, LPN  .  If any questions, please contact me.  Thank you ,  Verlee Rossetti; Care Guide Ambulatory Clinical Support [place] [facility] Direct Dial: [phone]

## 2023-01-03 DIAGNOSIS — H538 Other visual disturbances: Secondary | ICD-10-CM | POA: Diagnosis not present

## 2023-01-03 DIAGNOSIS — H4711 Papilledema associated with increased intracranial pressure: Secondary | ICD-10-CM | POA: Diagnosis not present

## 2023-01-03 DIAGNOSIS — H2513 Age-related nuclear cataract, bilateral: Secondary | ICD-10-CM | POA: Diagnosis not present

## 2023-01-03 DIAGNOSIS — H4710 Unspecified papilledema: Secondary | ICD-10-CM | POA: Diagnosis not present

## 2023-01-03 DIAGNOSIS — R519 Headache, unspecified: Secondary | ICD-10-CM | POA: Diagnosis not present

## 2023-01-03 DIAGNOSIS — H5203 Hypermetropia, bilateral: Secondary | ICD-10-CM | POA: Diagnosis not present

## 2023-01-03 DIAGNOSIS — M810 Age-related osteoporosis without current pathological fracture: Secondary | ICD-10-CM | POA: Diagnosis not present

## 2023-01-03 DIAGNOSIS — H52223 Regular astigmatism, bilateral: Secondary | ICD-10-CM | POA: Diagnosis not present

## 2023-01-04 DIAGNOSIS — H538 Other visual disturbances: Secondary | ICD-10-CM | POA: Diagnosis not present

## 2023-01-04 DIAGNOSIS — H4710 Unspecified papilledema: Secondary | ICD-10-CM | POA: Diagnosis not present

## 2023-01-08 ENCOUNTER — Ambulatory Visit (INDEPENDENT_AMBULATORY_CARE_PROVIDER_SITE_OTHER): Payer: Medicare Other

## 2023-01-08 ENCOUNTER — Ambulatory Visit: Payer: Medicare Other

## 2023-01-08 VITALS — Ht 64.0 in | Wt 168.0 lb

## 2023-01-08 DIAGNOSIS — Z0000 Encounter for general adult medical examination without abnormal findings: Secondary | ICD-10-CM | POA: Diagnosis not present

## 2023-01-08 DIAGNOSIS — H4710 Unspecified papilledema: Secondary | ICD-10-CM | POA: Diagnosis not present

## 2023-01-08 MED ORDER — MELOXICAM 15 MG PO TABS: 15.0000 mg | ORAL_TABLET | Freq: Every day | ORAL | 0 refills | Status: DC

## 2023-01-08 NOTE — Patient Instructions (Signed)
Erin Donovan , Thank you for taking time to come for your Medicare Wellness Visit. I appreciate your ongoing commitment to your health goals. Please review the following plan we discussed and let me know if I can assist you in the future.   These are the goals we discussed:  Goals       DIET - REDUCE PORTION SIZE (pt-stated)      "Live a healthy life"        This is a list of the screening recommended for you and due dates:  Health Maintenance  Topic Date Due   Zoster (Shingles) Vaccine (1 of 2) Never done   Screening for Lung Cancer  Never done   COVID-19 Vaccine (2 - Janssen risk series) 03/17/2020   Pneumonia Vaccine (1 of 1 - PCV) 06/18/2023*   Flu Shot  01/30/2023   Medicare Annual Wellness Visit  01/08/2024   Mammogram  07/03/2024   DTaP/Tdap/Td vaccine (2 - Td or Tdap) 07/31/2026   Colon Cancer Screening  12/06/2026   DEXA scan (bone density measurement)  Completed   Hepatitis C Screening  Completed   HPV Vaccine  Aged Out  *Topic was postponed. The date shown is not the original due date.   Health Maintenance After Age 36 After age 52, you are at a higher risk for certain long-term diseases and infections as well as injuries from falls. Falls are a major cause of broken bones and head injuries in people who are older than age 55. Getting regular preventive care can help to keep you healthy and well. Preventive care includes getting regular testing and making lifestyle changes as recommended by your health care provider. Talk with your health care provider about: Which screenings and tests you should have. A screening is a test that checks for a disease when you have no symptoms. A diet and exercise plan that is right for you. What should I know about screenings and tests to prevent falls? Screening and testing are the best ways to find a health problem early. Early diagnosis and treatment give you the best chance of managing medical conditions that are common after age 68.  Certain conditions and lifestyle choices may make you more likely to have a fall. Your health care provider may recommend: Regular vision checks. Poor vision and conditions such as cataracts can make you more likely to have a fall. If you wear glasses, make sure to get your prescription updated if your vision changes. Medicine review. Work with your health care provider to regularly review all of the medicines you are taking, including over-the-counter medicines. Ask your health care provider about any side effects that may make you more likely to have a fall. Tell your health care provider if any medicines that you take make you feel dizzy or sleepy. Strength and balance checks. Your health care provider may recommend certain tests to check your strength and balance while standing, walking, or changing positions. Foot health exam. Foot pain and numbness, as well as not wearing proper footwear, can make you more likely to have a fall. Screenings, including: Osteoporosis screening. Osteoporosis is a condition that causes the bones to get weaker and break more easily. Blood pressure screening. Blood pressure changes and medicines to control blood pressure can make you feel dizzy. Depression screening. You may be more likely to have a fall if you have a fear of falling, feel depressed, or feel unable to do activities that you used to do. Alcohol use screening. Using  too much alcohol can affect your balance and may make you more likely to have a fall. Follow these instructions at home: Lifestyle Do not drink alcohol if: Your health care provider tells you not to drink. If you drink alcohol: Limit how much you have to: 0-1 drink a day for women. 0-2 drinks a day for men. Know how much alcohol is in your drink. In the [place] one drink equals one 12 oz bottle of beer (355 mL), one 5 oz glass of wine (148 mL), or one 1 oz glass of hard liquor (44 mL). Do not use any products that contain nicotine or  tobacco. These products include cigarettes, chewing tobacco, and vaping devices, such as e-cigarettes. If you need help quitting, ask your health care provider. Activity  Follow a regular exercise program to stay fit. This will help you maintain your balance. Ask your health care provider what types of exercise are appropriate for you. If you need a cane or walker, use it as recommended by your health care provider. Wear supportive shoes that have nonskid soles. Safety  Remove any tripping hazards, such as rugs, cords, and clutter. Install safety equipment such as grab bars in bathrooms and safety rails on stairs. Keep rooms and walkways well-lit. General instructions Talk with your health care provider about your risks for falling. Tell your health care provider if: You fall. Be sure to tell your health care provider about all falls, even ones that seem minor. You feel dizzy, tiredness (fatigue), or off-balance. Take over-the-counter and prescription medicines only as told by your health care provider. These include supplements. Eat a healthy diet and maintain a healthy weight. A healthy diet includes low-fat dairy products, low-fat (lean) meats, and fiber from whole grains, beans, and lots of fruits and vegetables. Stay current with your vaccines. Schedule regular health, dental, and eye exams. Summary Having a healthy lifestyle and getting preventive care can help to protect your health and wellness after age 68. Screening and testing are the best way to find a health problem early and help you avoid having a fall. Early diagnosis and treatment give you the best chance for managing medical conditions that are more common for people who are older than age 20. Falls are a major cause of broken bones and head injuries in people who are older than age 18. Take precautions to prevent a fall at home. Work with your health care provider to learn what changes you can make to improve your health and  wellness and to prevent falls. This information is not intended to replace advice given to you by your health care provider. Make sure you discuss any questions you have with your health care provider. Document Revised: 11/06/2020 Document Reviewed: 11/06/2020 Elsevier Patient Education  2024 [employer].

## 2023-01-08 NOTE — Progress Notes (Signed)
Subjective:    Erin Donovan is a 67 y.o. female who presents for a Welcome to Medicare exam. Patient has had Medicare for more than a year, started in June of 2023.    Cardiac Risk Factors include: advanced age (>67men, >38 women)      Objective:    Today's Vitals   01/08/23 0937  Weight: 168 lb (76.2 kg)  Height: 5\' 4"  (1.626 m)  Body mass index is 28.84 kg/m.  Medications Outpatient Encounter Medications as of 01/08/2023  Medication Sig   cetirizine (ZYRTEC) 10 MG tablet Take 1 tablet (10 mg total) by mouth as needed for allergies. otc   Cholecalciferol (VITAMIN D3) 5000 units CAPS Take 1 capsule (5,000 Units total) by mouth daily.   fluticasone (FLONASE) 50 MCG/ACT nasal spray Place 2 sprays into both nostrils daily.   magnesium oxide (MAG-OX) 400 MG tablet Take 400 mg by mouth daily.   Zinc 50 MG CAPS Take 1 capsule by mouth daily.   [DISCONTINUED] meloxicam (MOBIC) 15 MG tablet Take 15 mg by mouth daily. [facility]/ ortho   amoxicillin (AMOXIL) 500 MG capsule  (Patient not taking: Reported on 01/08/2023)   meloxicam (MOBIC) 15 MG tablet Take 1 tablet (15 mg total) by mouth daily. [facility]/ ortho   No facility-administered encounter medications on file as of 01/08/2023.     History: Past Medical History:  Diagnosis Date   Arthritis, rheumatoid (HCC) 11/2013   Basal cell carcinoma of skin of face    "right side"   Chronic lumbar pain    "broke L1 last year" (09/17/2016)   Dental crowns present    2 implants - right upper - tooth 4 & 5   Depression    single episode   Family history of ovarian cancer 01/03/2014   genetic letter sent   History of mammogram 07/30/2010   neg/cat 2   Osteoarthritis    "hips" (09/17/2016)   Osteoporosis 2017   c compression fracture of spine   Osteosclerosis    Right hip pain    Weight gain    Past Surgical History:  Procedure Laterality Date   BASAL CELL CARCINOMA EXCISION  1990s   "right side of my face"   COLONOSCOPY WITH  PROPOFOL N/A 12/05/2016   Procedure: COLONOSCOPY WITH PROPOFOL;  Surgeon: Midge Minium, MD;  Location[facility];  Service: Endoscopy;  Laterality: N/A;   JOINT REPLACEMENT     POLYPECTOMY N/A 12/05/2016   Procedure: POLYPECTOMY;  Surgeon: Midge Minium, MD;  Location[facility];  Service: Endoscopy;  Laterality: N/A;   TOTAL HIP ARTHROPLASTY Right 09/17/2016   TOTAL HIP ARTHROPLASTY Right 09/17/2016   Procedure: RIGHT TOTAL HIP ARTHROPLASTY ANTERIOR APPROACH;  Surgeon: Sheral Apley, MD;  Location: [facility];  Service: Orthopedics;  Laterality: Right;    Family History  Problem Relation Age of Onset   Stroke Sister 72   Cervical cancer Sister    Breast cancer Sister 48   Other Sister        Vulva Cancer   Diabetes Other    Hypertension Other    Osteoporosis Other    Social History   Occupational History   Not on file  Tobacco Use   Smoking status: Former    Packs/day: 1.00    Years: 46.00    Additional pack years: 0.00    Total pack years: 46.00    Types: Cigarettes    Quit date: 06/07/2019    Years since quitting: 3.5  Smokeless tobacco: Never   Tobacco comments:    startedage 16- gave info on patches and pills  Vaping Use   Vaping Use: Never used  Substance and Sexual Activity   Alcohol use: No   Drug use: No   Sexual activity: Yes    Birth control/protection: Post-menopausal    Tobacco Counseling Counseling given: Yes Tobacco comments: startedage 16- gave info on patches and pills   Immunizations and Health Maintenance Immunization History  Administered Date(s) Administered   Influenza,inj,Quad PF,6+ Mos 07/21/2018, 07/01/2019, 06/20/2020, 06/07/2021   Influenza-Unspecified 04/09/2017, 06/17/2022   Janssen (J&J) SARS-COV-2 Vaccination 02/18/2020   Tdap 07/31/2016   Health Maintenance Due  Topic Date Due   Lung Cancer Screening  Never done    Activities of Daily Living    01/08/2023    9:39 AM  In your present state of health, do you  have any difficulty performing the following activities:  Hearing? 0  Vision? 0  Difficulty concentrating or making decisions? 0  Walking or climbing stairs? 0  Dressing or bathing? 0  Doing errands, shopping? 0  Preparing Food and eating ? N  Using the Toilet? N  In the past six months, have you accidently leaked urine? N  Do you have problems with loss of bowel control? N  Managing your Medications? N  Managing your Finances? N  Housekeeping or managing your Housekeeping? N      Advanced Directives: Does Patient Have a Medical Advance Directive?: Yes Type of Advance Directive: Healthcare Power of Attorney, Living will Does patient want to make changes to medical advance directive?: No - Patient declined Copy of Healthcare Power of Attorney in Chart?: No - copy requested    Assessment:    This is a routine wellness examination for this patient . Erin Donovan  Vision/Hearing screen Hearing Screening - Comments:: Hearing well over phone Vision Screening - Comments:: Wears glasses- seeing eye doctor  Dietary issues and exercise activities discussed:      Goals       DIET - REDUCE PORTION SIZE (pt-stated)      "Live a healthy life"       Depression Screen    01/08/2023    9:47 AM 01/08/2023    9:44 AM 06/17/2022    3:40 PM 06/07/2021    8:33 AM  PHQ 2/9 Scores  PHQ - 2 Score 0 0 0 0  PHQ- 9 Score   0 0     Fall Risk    01/08/2023    9:46 AM  Fall Risk   Risk for fall due to : No Fall Risks  Follow up Falls evaluation completed    Cognitive Function:        01/08/2023    9:47 AM  6CIT Screen  What Year? 0 points  What month? 0 points  What time? 0 points  Count back from 20 0 points  Months in reverse 0 points  Repeat phrase 0 points  Total Score 0 points    Patient Care Team: Duanne Limerick, MD as PCP - General (Family Medicine)     Plan:     I have personally reviewed and noted the following in the patient's chart:   Medical and  social history Use of alcohol, tobacco or illicit drugs  Current medications and supplements Functional ability and status Nutritional status Physical activity Advanced directives List of other physicians Hospitalizations, surgeries, and ER visits in previous 12 months Vitals Screenings to include cognitive, depression, and falls  Referrals and appointments  In addition, I have reviewed and discussed with patient certain preventive protocols, quality metrics, and best practice recommendations. A written personalized care plan for preventive services as well as general preventive health recommendations were provided to patient. I went over the need for the shingrix vaccine- get at the pharmacy and the injection schedule. I made sure mammo and colonoscopy were up to date. I verified that Medicare started more than a year ago.     Everitt Amber 01/08/2023

## 2023-01-17 DIAGNOSIS — H4710 Unspecified papilledema: Secondary | ICD-10-CM | POA: Diagnosis not present

## 2023-01-31 DIAGNOSIS — G932 Benign intracranial hypertension: Secondary | ICD-10-CM | POA: Diagnosis not present

## 2023-01-31 DIAGNOSIS — H4710 Unspecified papilledema: Secondary | ICD-10-CM | POA: Diagnosis not present

## 2023-02-04 ENCOUNTER — Other Ambulatory Visit: Payer: Self-pay | Admitting: Family Medicine

## 2023-02-04 NOTE — Telephone Encounter (Signed)
Requested medication (s) are due for refill today: Yes  Requested medication (s) are on the active medication list: Yes  Last refill:  01/08/23  Future visit scheduled: Yes  Notes to clinic:  Manual review required      Requested Prescriptions  Pending Prescriptions Disp Refills   meloxicam (MOBIC) 15 MG tablet [Pharmacy Med Name: MELOXICAM 15 MG TABLET] 30 tablet 0    Sig: Take 1 tablet (15 mg total) by mouth daily. [facility]/ ortho     Analgesics:  COX2 Inhibitors Failed - 02/04/2023  2:24 AM      Failed - Manual Review: Labs are only required if the patient has taken medication for more than 8 weeks.      Failed - HGB in normal range and within 360 days    Hemoglobin  Date Value Ref Range Status  10/21/2019 13.5 11.1 - 15.9 g/dL Final         Failed - Cr in normal range and within 360 days    Creatinine, Ser  Date Value Ref Range Status  06/25/2022 1.10 (H) 0.57 - 1.00 mg/dL Final         Failed - HCT in normal range and within 360 days    Hematocrit  Date Value Ref Range Status  10/21/2019 41.2 34.0 - 46.6 % Final         Passed - AST in normal range and within 360 days    AST  Date Value Ref Range Status  06/25/2022 12 0 - 40 IU/L Final         Passed - ALT in normal range and within 360 days    ALT  Date Value Ref Range Status  06/25/2022 12 0 - 32 IU/L Final         Passed - eGFR is 30 or above and within 360 days    GFR calc Af Amer  Date Value Ref Range Status  10/21/2019 87 >59 mL/min/1.73 Final   GFR calc non Af Amer  Date Value Ref Range Status  10/21/2019 76 >59 mL/min/1.73 Final   eGFR  Date Value Ref Range Status  06/25/2022 56 (L) >59 mL/min/1.73 Final         Passed - Patient is not pregnant      Passed - Valid encounter within last 12 months    Recent Outpatient Visits           7 months ago Seasonal allergic rhinitis, unspecified trigger   [place] [facility]   1 year  ago Need for immunization against influenza   [facility]   2 years ago Seasonal allergic rhinitis, unspecified trigger   [place] [facility]   3 years ago Preop examination   [facility]   3 years ago Seasonal allergic rhinitis, unspecified trigger   [place] [facility]

## 2023-02-20 DIAGNOSIS — K080 Exfoliation of teeth due to systemic causes: Secondary | ICD-10-CM | POA: Diagnosis not present

## 2023-03-10 ENCOUNTER — Other Ambulatory Visit: Payer: Self-pay | Admitting: Family Medicine

## 2023-03-10 DIAGNOSIS — J302 Other seasonal allergic rhinitis: Secondary | ICD-10-CM

## 2023-03-11 NOTE — Telephone Encounter (Signed)
Requested Prescriptions  Refused Prescriptions Disp Refills   cetirizine (ZYRTEC) 10 MG tablet [Pharmacy Med Name: CETIRIZINE HCL 10 MG TABLET] 90 tablet 3    Sig: TAKE 1 TABLET (10 MG TOTAL) BY MOUTH AS NEEDED FOR ALLERGIES. OTC     Ear, Nose, and Throat:  Antihistamines 2 Failed - 03/10/2023  2:41 PM      Failed - Cr in normal range and within 360 days    Creatinine, Ser  Date Value Ref Range Status  06/25/2022 1.10 (H) 0.57 - 1.00 mg/dL Final         Passed - Valid encounter within last 12 months    Recent Outpatient Visits           8 months ago Seasonal allergic rhinitis, unspecified trigger   [place] [facility]   1 year ago Need for immunization against influenza   [facility]   2 years ago Seasonal allergic rhinitis, unspecified trigger   [place] [facility]   3 years ago Preop examination   [facility]   3 years ago Seasonal allergic rhinitis, unspecified trigger    [facility]       Future Appointments             In 2 weeks Duanne Limerick, MD [facility] at [facility]

## 2023-03-24 IMAGING — US US BREAST*L* LIMITED INC AXILLA
1 series · 7 of 7 positions shown · non-contrast
Comparison: Previous exam(s).

CLINICAL DATA: Recall for possible mass in the left breast.

EXAM:
ULTRASOUND OF THE LEFT BREAST

[Series 1: us breast*left* limited inc axilla · 0.05mm/px · 7 of 7 slices shown]
[im 1/7]
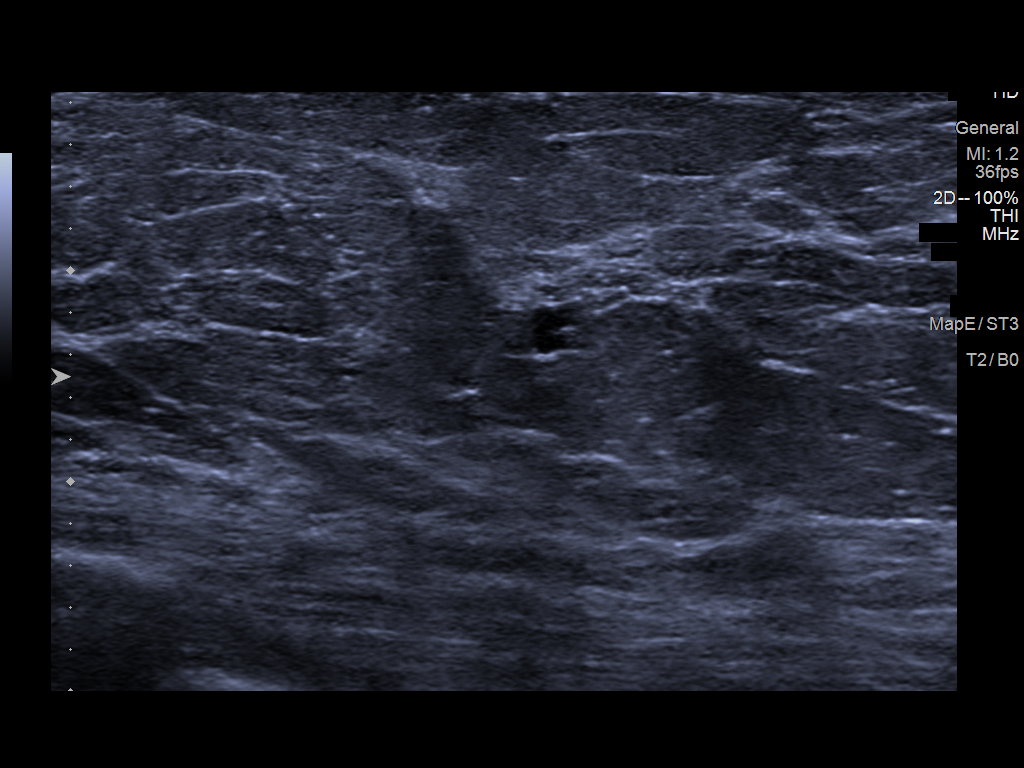
[im 2/7]
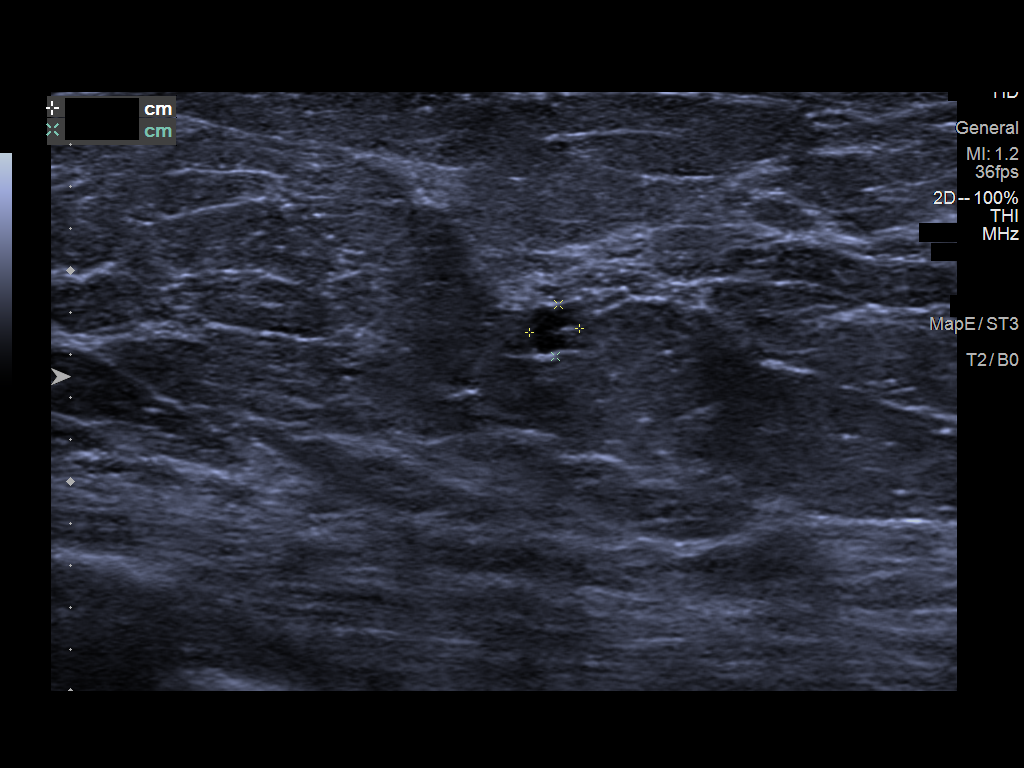
[im 3/7]
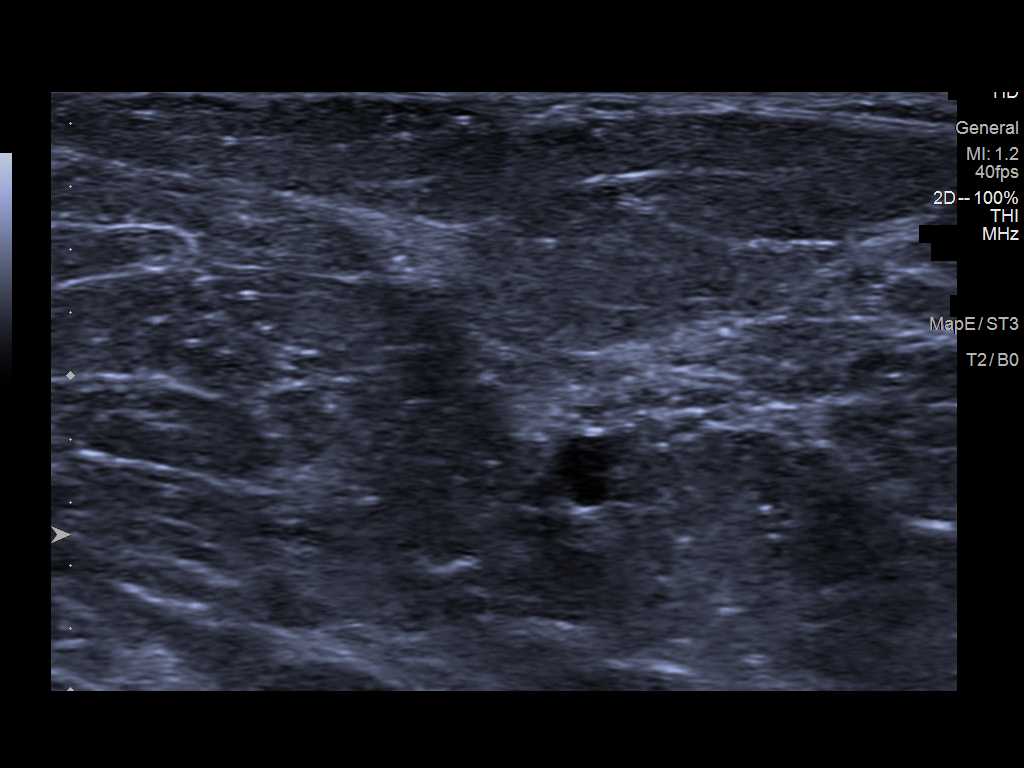
[im 4/7]
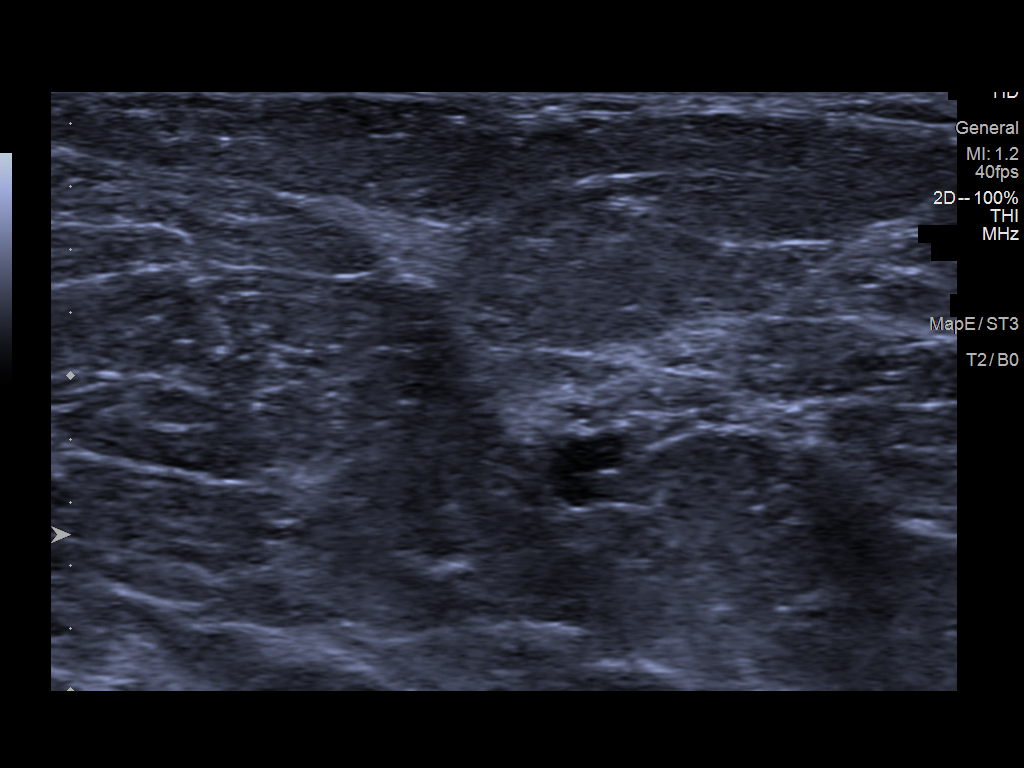
[im 5/7]
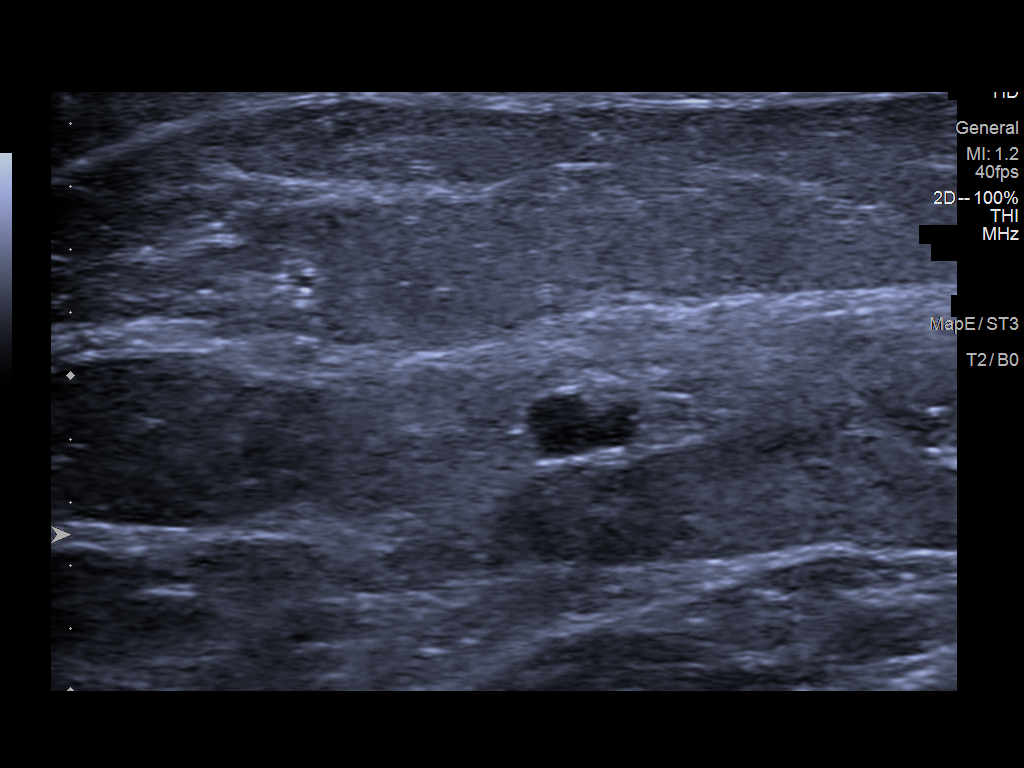
[im 6/7]
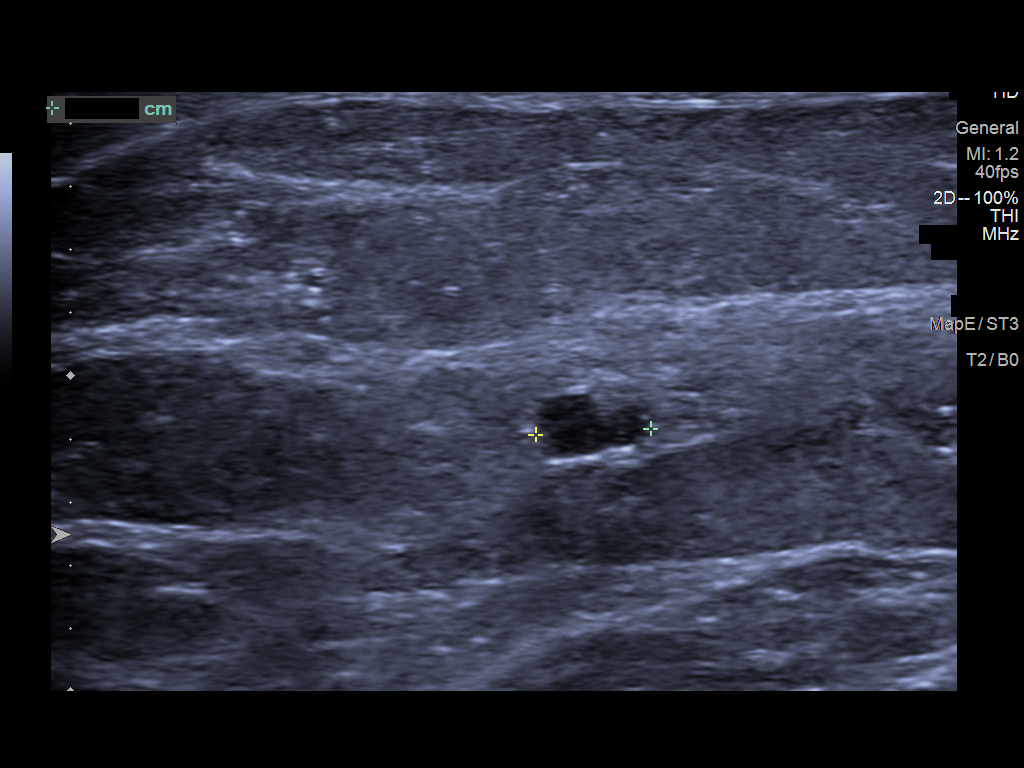
[im 7/7]
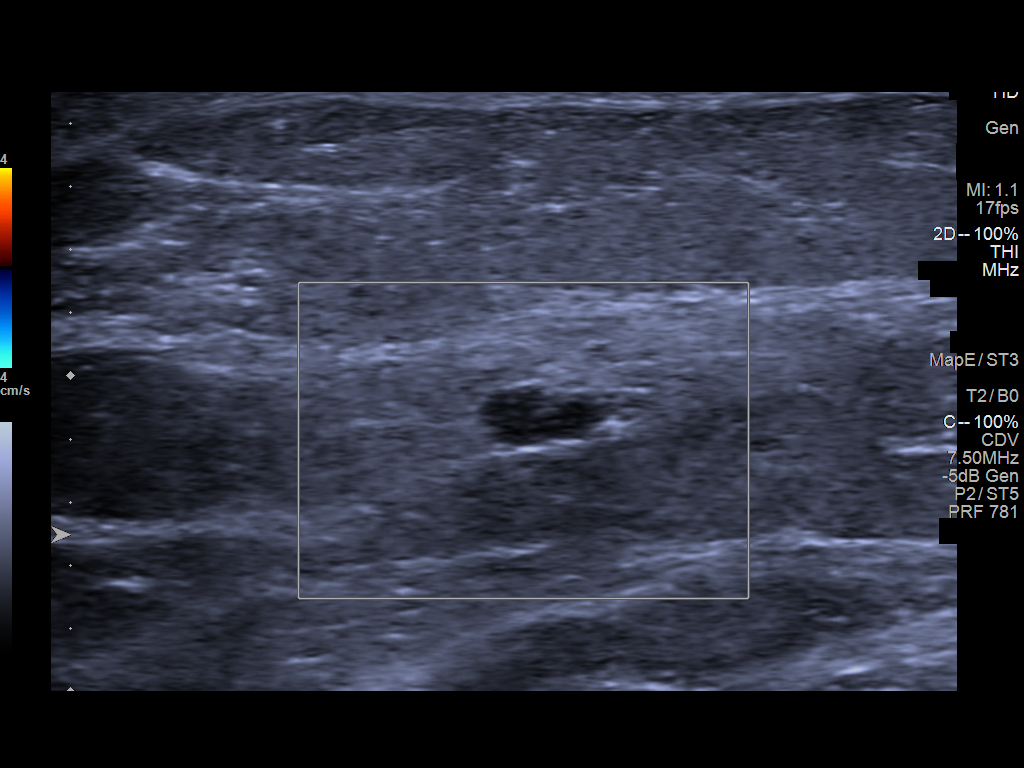

[7 of 7 positions shown; findings below may reference images not displayed]

FINDINGS: Targeted ultrasound of the left breast at the 3 o'clock position 1
cm from the nipple demonstrates a 0.2 x 0.3 x 0.4 cm cluster of
anechoic circumscribed microcysts, corresponding to the mammographic
finding.
IMPRESSION: A benign cluster of microcysts at the left breast 3 o'clock position
corresponds to the mass noted on recent screening mammogram.

RECOMMENDATION:
Annual screening mammogram.

I have discussed the findings and recommendations with the patient.
If applicable, a reminder letter will be sent to the patient
regarding the next appointment.

BI-RADS CATEGORY  2: Benign.

## 2023-03-25 ENCOUNTER — Ambulatory Visit: Payer: Medicare Other | Admitting: Family Medicine

## 2023-03-25 ENCOUNTER — Ambulatory Visit (INDEPENDENT_AMBULATORY_CARE_PROVIDER_SITE_OTHER): Payer: Medicare Other | Admitting: Family Medicine

## 2023-03-25 ENCOUNTER — Encounter: Payer: Self-pay | Admitting: Family Medicine

## 2023-03-25 VITALS — BP 128/68 | HR 80 | Ht 64.0 in | Wt 165.0 lb

## 2023-03-25 DIAGNOSIS — G932 Benign intracranial hypertension: Secondary | ICD-10-CM | POA: Diagnosis not present

## 2023-03-25 DIAGNOSIS — Z6828 Body mass index (BMI) 28.0-28.9, adult: Secondary | ICD-10-CM | POA: Diagnosis not present

## 2023-03-25 DIAGNOSIS — E663 Overweight: Secondary | ICD-10-CM

## 2023-03-25 NOTE — Patient Instructions (Signed)

## 2023-04-02 ENCOUNTER — Encounter: Payer: Self-pay | Admitting: Dietician

## 2023-04-02 ENCOUNTER — Encounter: Payer: Medicare Other | Attending: Family Medicine | Admitting: Dietician

## 2023-04-02 VITALS — Ht 64.5 in | Wt 160.0 lb

## 2023-04-02 DIAGNOSIS — Z713 Dietary counseling and surveillance: Secondary | ICD-10-CM | POA: Insufficient documentation

## 2023-04-02 DIAGNOSIS — G932 Benign intracranial hypertension: Secondary | ICD-10-CM | POA: Insufficient documentation

## 2023-04-02 DIAGNOSIS — Z6828 Body mass index (BMI) 28.0-28.9, adult: Secondary | ICD-10-CM | POA: Insufficient documentation

## 2023-04-02 DIAGNOSIS — E663 Overweight: Secondary | ICD-10-CM | POA: Diagnosis not present

## 2023-04-02 NOTE — Progress Notes (Signed)
Medical Nutrition Therapy: Visit start time: 1320  end time: 1410  Assessment:   Referral Diagnosis: idiopathic intracranial hypertension, overweight Other medical history/ diagnoses: osteoporosis, arthritis Psychosocial issues/ stress concerns: none  Medications, supplements: reconciled list in medical record   Current weight: 160lbs (patient reported) Height: 5'4.5" BMI: 27.04 Patient's personal weight goal: about 145lbs (BMI 24)  Progress and evaluation:  Patient reports loss of about 11lbs since July when making diet changes; she has regained a few pounds recently due to increase in dining out.  She has been experiencing numbness in left foot, of unknown cause, which is limiting activity. She will be seeing a specialist soon. Has been avoiding foods high in vitamin A due to triggering severe headaches secondary to the idiopathic intracranial hypertension. Changes in barometric pressure also trigger headaches  Food allergies: none known Special diet practices: none   Dietary Intake:  Usual eating pattern includes 3 meals and 2 snacks per day. Dining out frequency: 1-2 meals per week.   Breakfast: apple; 1/2 bagel + apple or [place] bacon + apple or just bacon Snack: none or same as pm Lunch: premier protein shake or 1/2 protein bar; chicken on keto wrap (45kcal) 10/1 deli [place] sandwich and onion rings Snack: occ handful nuts or 1/2 protein bar or graham crackers sometimes with peanut butter Supper: chicken on keto wrap or hamburger patty; 9/30 Cracker barrel-- pinto beans, mashed potatoes, green beans Snack: none or saame as pm Beverages: water, coffee in am  Physical activity: none recently due to numbness in L foot; stays generally active throughout the day   Intervention:   Nutrition Care Education:   Basic nutrition: appropriate nutrient balance;  Weight control: identifying healthy weight; importance of low sugar and low fat choices; appropriate portion sizes;  estimated energy needs for weight loss at 1300 kcal, provided guidance for 40-45% CHO, 25-30% pro, (30% fat) Idiopathic Intracranial Hypertension: identifying high sodium foods; identifying food sources of potassium, magnesium; food high in vitamin A, daily requirement for vitamin A, limiting portions and frequency of high vitamin A foods, possible difference between effects of beta-carotene vs preformed vitamin A (retinol); discussed keeping diary of food intake and monitoring symptoms.   Other intervention notes: Patient has been working diligently to lose weight and limit vitamin A intake. She is motivated to continue. Goal is to continue with current eating pattern, resume regular exercise as able. No follow up scheduled at this time; encouraged patient to call with any questions or concerns.   Nutritional Diagnosis:  [place]-2.1 Inpaired nutrition utilization As related to idiopathic intracranial hypertension.  As evidenced by severe headaches triggered by consumption of foods high in vitamin A. [place]-3.3 Overweight/obesity As related to history of excess calories.  As evidenced by patient with current BMI of 27 working towards goal of BMI of 24.   Education Materials given:  Vitamin A content of foods ([place] dept of health) Vitamin A handout ([facility]) Plate Planner with food lists, sample meal pattern Visit summary with goals/ instructions to be viewed via patient portal   Learner/ who was taught:  Patient   Level of understanding: Verbalizes/ demonstrates competency  Demonstrated degree of understanding via:   Teach back Learning barriers: None  Willingness to learn/ readiness for change: Eager, change in progress  Monitoring and Evaluation:  Dietary intake, exercise, headaches, and body weight      follow up: prn

## 2023-04-02 NOTE — Patient Instructions (Signed)
Continue with current low calorie and low vitamin A eating pattern Consider keeping a food diary and monitor headache symptoms in relation to intake of high vitamin A foods.  Resume regular exercise when able.

## 2023-04-19 ENCOUNTER — Other Ambulatory Visit: Payer: Self-pay | Admitting: Family Medicine

## 2023-04-19 DIAGNOSIS — J302 Other seasonal allergic rhinitis: Secondary | ICD-10-CM

## 2023-05-16 DIAGNOSIS — H4711 Papilledema associated with increased intracranial pressure: Secondary | ICD-10-CM | POA: Diagnosis not present

## 2023-05-16 DIAGNOSIS — G932 Benign intracranial hypertension: Secondary | ICD-10-CM | POA: Diagnosis not present

## 2023-06-18 DIAGNOSIS — H2513 Age-related nuclear cataract, bilateral: Secondary | ICD-10-CM | POA: Diagnosis not present

## 2023-06-18 DIAGNOSIS — H5203 Hypermetropia, bilateral: Secondary | ICD-10-CM | POA: Diagnosis not present

## 2023-06-18 DIAGNOSIS — H4710 Unspecified papilledema: Secondary | ICD-10-CM | POA: Diagnosis not present

## 2023-06-18 DIAGNOSIS — H52223 Regular astigmatism, bilateral: Secondary | ICD-10-CM | POA: Diagnosis not present

## 2023-06-19 DIAGNOSIS — K080 Exfoliation of teeth due to systemic causes: Secondary | ICD-10-CM | POA: Diagnosis not present

## 2023-07-08 ENCOUNTER — Ambulatory Visit (INDEPENDENT_AMBULATORY_CARE_PROVIDER_SITE_OTHER): Payer: Medicare Other | Admitting: Family Medicine

## 2023-07-08 ENCOUNTER — Encounter: Payer: Self-pay | Admitting: Family Medicine

## 2023-07-08 VITALS — BP 122/76 | HR 56 | Ht 64.0 in | Wt 152.0 lb

## 2023-07-08 DIAGNOSIS — E78019 Familial hypercholesterolemia, unspecified: Secondary | ICD-10-CM

## 2023-07-08 DIAGNOSIS — M1611 Unilateral primary osteoarthritis, right hip: Secondary | ICD-10-CM

## 2023-07-08 DIAGNOSIS — E7801 Familial hypercholesterolemia: Secondary | ICD-10-CM | POA: Diagnosis not present

## 2023-07-08 DIAGNOSIS — J302 Other seasonal allergic rhinitis: Secondary | ICD-10-CM | POA: Diagnosis not present

## 2023-07-08 DIAGNOSIS — R69 Illness, unspecified: Secondary | ICD-10-CM | POA: Diagnosis not present

## 2023-07-08 DIAGNOSIS — Z23 Encounter for immunization: Secondary | ICD-10-CM

## 2023-07-08 MED ORDER — FLUTICASONE PROPIONATE 50 MCG/ACT NA SUSP
2.0000 | Freq: Every day | NASAL | 11 refills | Status: AC
Start: 2023-07-08 — End: ?

## 2023-07-08 MED ORDER — MELOXICAM 15 MG PO TABS: 15.0000 mg | ORAL_TABLET | Freq: Every day | ORAL | 11 refills | Status: DC

## 2023-07-08 MED ORDER — CETIRIZINE HCL 10 MG PO TABS: 10.0000 mg | ORAL_TABLET | ORAL | 3 refills | Status: DC | PRN

## 2023-07-08 NOTE — Progress Notes (Signed)
 Date:  07/08/2023   Name:  Erin Donovan   DOB:  August 19, 1955   MRN:  [id number]   Chief Complaint: Hypertension  Hypertension This is a chronic problem. The current episode started more than 1 year ago. The problem is controlled. Pertinent negatives include no anxiety, blurred vision, chest pain, headaches, malaise/fatigue, neck pain, orthopnea, palpitations, peripheral edema, PND, shortness of breath or sweats. There are no associated agents to hypertension.  Hip Pain  Injury mechanism: hx of hip fracture. The pain is present in the right hip. The quality of the pain is described as aching. The pain is moderate. The pain has been Intermittent since onset. Pertinent negatives include no inability to bear weight, loss of motion, loss of sensation, muscle weakness or numbness. The treatment provided moderate relief.  URI  This is a chronic problem. The current episode started more than 1 year ago. The problem has been waxing and waning. Associated symptoms include rhinorrhea and sneezing. Pertinent negatives include no chest pain, headaches, neck pain, sinus pain or wheezing. She has tried nothing for the symptoms. The treatment provided mild relief.    Lab Results  Component Value Date   NA 140 06/25/2022   K 4.1 06/25/2022   CO2 24 06/25/2022   GLUCOSE 110 (H) 06/25/2022   BUN 15 06/25/2022   CREATININE 1.10 (H) 06/25/2022   CALCIUM  8.7 06/25/2022   EGFR 56 (L) 06/25/2022   GFRNONAA 76 10/21/2019   Lab Results  Component Value Date   CHOL 160 06/25/2022   HDL 58 06/25/2022   LDLCALC 88 06/25/2022   TRIG 72 06/25/2022   CHOLHDL 3.4 07/31/2016   Lab Results  Component Value Date   TSH 0.627 07/31/2016   Lab Results  Component Value Date   HGBA1C 5.3 10/21/2019   Lab Results  Component Value Date   WBC 6.3 10/21/2019   HGB 13.5 10/21/2019   HCT 41.2 10/21/2019   MCV 91 10/21/2019   PLT 245 10/21/2019   Lab Results  Component Value Date   ALT 12 06/25/2022   AST  12 06/25/2022   ALKPHOS 83 06/25/2022   BILITOT 0.3 06/25/2022   Lab Results  Component Value Date   VD25OH 57.7 09/02/2017     Review of Systems  Constitutional:  Negative for malaise/fatigue.  HENT:  Positive for rhinorrhea and sneezing. Negative for hearing loss, nosebleeds and sinus pain.   Eyes:  Negative for blurred vision, photophobia and visual disturbance.  Respiratory:  Negative for choking, shortness of breath, wheezing and stridor.   Cardiovascular:  Negative for chest pain, palpitations, orthopnea and PND.  Musculoskeletal:  Negative for neck pain.  Neurological:  Negative for numbness and headaches.    Patient Active Problem List   Diagnosis Date Noted   Current use of proton pump inhibitor 09/02/2017   Special screening for malignant neoplasms, colon    Polyp of sigmoid colon    Osteoarthritis 09/17/2016   Allergic rhinitis 08/27/2016   Age-related osteoporosis without current pathological fracture 07/31/2016   Hx of compression fracture of spine 07/31/2016   Obesity (BMI 30.0-34.9) 07/31/2016   Smoker 07/31/2016    Allergies  Allergen Reactions   No Known Allergies     Past Surgical History:  Procedure Laterality Date   BASAL CELL CARCINOMA EXCISION  1990s   right side of my face   COLONOSCOPY WITH PROPOFOL  N/A 12/05/2016   Procedure: COLONOSCOPY WITH PROPOFOL ;  Surgeon: Jinny Carmine, MD;  [facility];  Service: Endoscopy;  Laterality: N/A;   JOINT REPLACEMENT     POLYPECTOMY N/A 12/05/2016   Procedure: POLYPECTOMY;  Surgeon: Jinny Carmine, MD;  Location: [facility];  Service: Endoscopy;  Laterality: N/A;   TOTAL HIP ARTHROPLASTY Right 09/17/2016   TOTAL HIP ARTHROPLASTY Right 09/17/2016   Procedure: RIGHT TOTAL HIP ARTHROPLASTY ANTERIOR APPROACH;  Surgeon: Evalene JONETTA Chancy, MD;  Location: [facility] OR;  Service: Orthopedics;  Laterality: Right;    Social History   Tobacco Use   Smoking status: Former    Current packs/day: 0.00     Average packs/day: 1 pack/day for 46.0 years (46.0 ttl pk-yrs)    Types: Cigarettes    Start date: 06/06/1973    Quit date: 06/07/2019    Years since quitting: 4.0   Smokeless tobacco: Never   Tobacco comments:    startedage 16- gave info on patches and pills  Vaping Use   Vaping status: Never Used  Substance Use Topics   Alcohol use: No   Drug use: No     Medication list has been reviewed and updated.  Current Meds  Medication Sig   Cholecalciferol (VITAMIN D3) 5000 units CAPS Take 1 capsule (5,000 Units total) by mouth daily.   magnesium  oxide (MAG-OX) 400 MG tablet Take 400 mg by mouth daily.   Zinc 50 MG CAPS Take 1 capsule by mouth daily.   [DISCONTINUED] cetirizine  (ZYRTEC ) 10 MG tablet Take 1 tablet (10 mg total) by mouth as needed for allergies. otc   [DISCONTINUED] fluticasone  (FLONASE ) 50 MCG/ACT nasal spray SPRAY 2 SPRAYS INTO EACH NOSTRIL EVERY DAY   [DISCONTINUED] meloxicam  (MOBIC ) 15 MG tablet Take 1 tablet (15 mg total) by mouth daily. [facility]/ ortho       07/08/2023    9:37 AM 06/17/2022    3:40 PM 06/07/2021    8:33 AM 09/14/2020    8:24 AM  GAD 7 : Generalized Anxiety Score  Nervous, Anxious, on Edge 0 0 0 0  Control/stop worrying 0 0 0 0  Worry too much - different things 0 0 0 0  Trouble relaxing 0 0 0 0  Restless 0 0 0 0  Easily annoyed or irritable 0 0 0 0  Afraid - awful might happen 0 0 0 0  Total GAD 7 Score 0 0 0 0  Anxiety Difficulty Not difficult at all Not difficult at all         07/08/2023    9:37 AM 04/02/2023    1:27 PM 01/08/2023    9:47 AM  Depression screen PHQ 2/9  Decreased Interest 0 0 0  Down, Depressed, Hopeless 0 0 0  PHQ - 2 Score 0 0 0  Altered sleeping 0    Tired, decreased energy 0    Change in appetite 0    Feeling bad or failure about yourself  0    Trouble concentrating 0    Moving slowly or fidgety/restless 0    Suicidal thoughts 0    PHQ-9 Score 0    Difficult doing work/chores Not difficult at all      BP  Readings from Last 3 Encounters:  07/08/23 122/76  03/25/23 128/68  06/17/22 129/79    Physical Exam Vitals and nursing note reviewed. Exam conducted with a chaperone present.  Constitutional:      General: She is not in acute distress.    Appearance: She is not diaphoretic.  HENT:     Head: Normocephalic and atraumatic.     Right  Ear: External ear normal.     Left Ear: External ear normal.     Nose: Nose normal.     Mouth/Throat:     Mouth: Mucous membranes are moist.     Pharynx: No oropharyngeal exudate or posterior oropharyngeal erythema.  Eyes:     General:        Right eye: No discharge.        Left eye: No discharge.     Conjunctiva/sclera: Conjunctivae normal.     Pupils: Pupils are equal, round, and reactive to light.  Neck:     Thyroid : No thyromegaly.     Vascular: No JVD.  Cardiovascular:     Rate and Rhythm: Normal rate and regular rhythm.     Heart sounds: Normal heart sounds. No murmur heard.    No friction rub. No gallop.  Pulmonary:     Effort: Pulmonary effort is normal.     Breath sounds: Normal breath sounds. No wheezing, rhonchi or rales.  Abdominal:     General: Bowel sounds are normal.     Palpations: Abdomen is soft. There is no mass.     Tenderness: There is no abdominal tenderness. There is no guarding.  Musculoskeletal:        General: Normal range of motion.     Cervical back: Normal range of motion and neck supple.  Lymphadenopathy:     Cervical: No cervical adenopathy.  Skin:    General: Skin is warm and dry.  Neurological:     Mental Status: She is alert.     Deep Tendon Reflexes: Reflexes are normal and symmetric.     Wt Readings from Last 3 Encounters:  07/08/23 152 lb (68.9 kg)  04/02/23 160 lb (72.6 kg)  03/25/23 165 lb (74.8 kg)    BP 122/76   Pulse (!) 56   Ht 5' 4 (1.626 m)   Wt 152 lb (68.9 kg)   SpO2 96%   BMI 26.09 kg/m   Assessment and Plan: 1. Arthritis of right hip (Primary) Chronic.  Episodic.  Stable.   Patient is having increased pain in the hip with surgery.  We will refill her meloxicam  and I have suggested that we may need to have this revisited by orthopedic evaluation. - meloxicam  (MOBIC ) 15 MG tablet; Take 1 tablet (15 mg total) by mouth daily. [facility]/ ortho  Dispense: 30 tablet; Refill: 11  2. Familial hypercholesterolemia .  Controlled.  Stable.  Currently is controlled with diet and we will continue with dietary guidelines we will check lipid panel for LDL control. - Lipid Panel With LDL/HDL Ratio  3. Taking medication for chronic disease Patient currently taking medication which may affect GFR and we will check CMP for electrolytes and GFR. - Comprehensive metabolic panel  4. Seasonal allergic rhinitis, unspecified trigger Chronic.  Controlled.  Stable.  Will refill Flonase  and Zyrtec  for as-needed basis. - fluticasone  (FLONASE ) 50 MCG/ACT nasal spray; Place 2 sprays into both nostrils daily.  Dispense: 48 mL; Refill: 11 - cetirizine  (ZYRTEC ) 10 MG tablet; Take 1 tablet (10 mg total) by mouth as needed for allergies. otc  Dispense: 90 tablet; Refill: 3  5. Need for immunization against influenza Discussed and administered - Flu Vaccine Trivalent High Dose (Fluad)  6. Need for pneumococcal 20-valent conjugate vaccination Discussed and administered - Pneumococcal conjugate vaccine 20-valent     Cathryne Molt, MD

## 2023-07-08 NOTE — Patient Instructions (Signed)

## 2023-07-09 ENCOUNTER — Encounter: Payer: Self-pay | Admitting: Family Medicine

## 2023-07-09 LAB — LIPID PANEL WITH LDL/HDL RATIO
Cholesterol, Total: 213 mg/dL — ABNORMAL HIGH (ref 100–199)
HDL: 67 mg/dL
LDL Chol Calc (NIH): 133 mg/dL — ABNORMAL HIGH (ref 0–99)
LDL/HDL Ratio: 2 ratio (ref 0.0–3.2)
Triglycerides: 76 mg/dL (ref 0–149)
VLDL Cholesterol Cal: 13 mg/dL (ref 5–40)

## 2023-07-09 LAB — COMPREHENSIVE METABOLIC PANEL
ALT: 19 [IU]/L (ref 0–32)
AST: 26 [IU]/L (ref 0–40)
Albumin: 4.3 g/dL (ref 3.9–4.9)
Alkaline Phosphatase: 127 [IU]/L — ABNORMAL HIGH (ref 44–121)
BUN/Creatinine Ratio: 17 (ref 12–28)
BUN: 14 mg/dL (ref 8–27)
Bilirubin Total: 0.3 mg/dL (ref 0.0–1.2)
CO2: 20 mmol/L (ref 20–29)
Calcium: 9.3 mg/dL (ref 8.7–10.3)
Chloride: 107 mmol/L — ABNORMAL HIGH (ref 96–106)
Creatinine, Ser: 0.82 mg/dL (ref 0.57–1.00)
Globulin, Total: 2.4 g/dL (ref 1.5–4.5)
Glucose: 86 mg/dL (ref 70–99)
Potassium: 4.8 mmol/L (ref 3.5–5.2)
Sodium: 140 mmol/L (ref 134–144)
Total Protein: 6.7 g/dL (ref 6.0–8.5)
eGFR: 79 mL/min/{1.73_m2} (ref >59)

## 2023-08-22 DIAGNOSIS — Z79899 Other long term (current) drug therapy: Secondary | ICD-10-CM | POA: Diagnosis not present

## 2023-08-22 DIAGNOSIS — G932 Benign intracranial hypertension: Secondary | ICD-10-CM | POA: Diagnosis not present

## 2023-10-28 DIAGNOSIS — K080 Exfoliation of teeth due to systemic causes: Secondary | ICD-10-CM | POA: Diagnosis not present

## 2023-12-23 DIAGNOSIS — H4710 Unspecified papilledema: Secondary | ICD-10-CM | POA: Diagnosis not present

## 2023-12-23 DIAGNOSIS — H2513 Age-related nuclear cataract, bilateral: Secondary | ICD-10-CM | POA: Diagnosis not present

## 2024-02-10 DIAGNOSIS — K080 Exfoliation of teeth due to systemic causes: Secondary | ICD-10-CM | POA: Diagnosis not present

## 2024-02-20 DIAGNOSIS — H4710 Unspecified papilledema: Secondary | ICD-10-CM | POA: Diagnosis not present

## 2024-02-20 DIAGNOSIS — G932 Benign intracranial hypertension: Secondary | ICD-10-CM | POA: Diagnosis not present

## 2024-03-17 ENCOUNTER — Encounter: Payer: Self-pay | Admitting: *Deleted

## 2024-03-17 ENCOUNTER — Ambulatory Visit: Admitting: Emergency Medicine

## 2024-03-17 VITALS — Ht 64.5 in | Wt 147.0 lb

## 2024-03-17 DIAGNOSIS — Z1231 Encounter for screening mammogram for malignant neoplasm of breast: Secondary | ICD-10-CM

## 2024-03-17 DIAGNOSIS — Z87891 Personal history of nicotine dependence: Secondary | ICD-10-CM

## 2024-03-17 DIAGNOSIS — Z0000 Encounter for general adult medical examination without abnormal findings: Secondary | ICD-10-CM

## 2024-03-17 DIAGNOSIS — H9193 Unspecified hearing loss, bilateral: Secondary | ICD-10-CM

## 2024-03-17 NOTE — Patient Instructions (Signed)
 Erin Donovan,  Thank you for taking the time for your Medicare Wellness Visit. I appreciate your continued commitment to your health goals. Please review the care plan we discussed, and feel free to reach out if I can assist you further.  Medicare recommends these wellness visits once per year to help you and your care team stay ahead of potential health issues. These visits are designed to focus on prevention, allowing your provider to concentrate on managing your acute and chronic conditions during your regular appointments.  Please note that Annual Wellness Visits do not include a physical exam. Some assessments may be limited, especially if the visit was conducted virtually. If needed, we may recommend a separate in-person follow-up with your provider.  Ongoing Care Seeing your primary care provider every 3 to 6 months helps us  monitor your health and provide consistent, personalized care.   Referrals If a referral was made during today's visit and you haven't received any updates within two weeks, please contact the referred provider directly to check on the status.  Pulmonary Referral for Lung Cancer Screening: [facility] at [address] [place] Phone: [phone]   I have placed a referral to [facility] to have your hearing evaluated. The ph# is [phone].  Recommended Screenings:   Get the flu vaccine at your OV on 04/20/24 or at your convenience.  Please call to schedule your mammogram:  [facility] at [facility] Address: [address], [place], [place] Phone: [phone]  [facility] at [facility] [address] [place], [place] [address] Phone: [phone]     Health Maintenance  Topic Date Due   Zoster (Shingles) Vaccine (1 of 2) Never done   Screening for Lung Cancer  Never done   COVID-19 Vaccine (2 - Janssen risk series) 03/17/2020   Flu  Shot  01/30/2024   Breast Cancer Screening  07/03/2024   DEXA scan (bone density measurement)  11/11/2024   Medicare Annual Wellness Visit  03/17/2025   DTaP/Tdap/Td vaccine (2 - Td or Tdap) 07/31/2026   Colon Cancer Screening  12/06/2026   Pneumococcal Vaccine for age over 23  Completed   Hepatitis C Screening  Completed   HPV Vaccine  Aged Out   Meningitis B Vaccine  Aged Out       03/17/2024    2:54 PM  Advanced Directives  Does Patient Have a Medical Advance Directive? No  Would patient like information on creating a medical advance directive? Yes (MAU/Ambulatory/Procedural Areas - Information given)   Advance Care Planning is important because it: Ensures you receive medical care that aligns with your values, goals, and preferences. Provides guidance to your family and loved ones, reducing the emotional burden of decision-making during critical moments.  Vision: Annual vision screenings are recommended for early detection of glaucoma, cataracts, and diabetic retinopathy. These exams can also reveal signs of chronic conditions such as diabetes and high blood pressure.  Dental: Annual dental screenings help detect early signs of oral cancer, gum disease, and other conditions linked to overall health, including heart disease and diabetes.  Please see the attached documents for additional preventive care recommendations.    Fall Prevention in the Home, Adult Falls can cause injuries and affect people of all ages. There are many simple things that you can do to make your home safe and to help prevent falls. If you need it, ask for help making these changes. What actions  can I take to prevent falls? General information Use good lighting in all rooms. Make sure to: Replace any light bulbs that burn out. Turn on lights if it is dark and use night-lights. Keep items that you use often in easy-to-reach places. Lower the shelves around your home if needed. Move furniture so that there  are clear paths around it. Do not keep throw rugs or other things on the floor that can make you trip. If any of your floors are uneven, fix them. Add color or contrast paint or tape to clearly mark and help you see: Grab bars or handrails. First and last steps of staircases. Where the edge of each step is. If you use a ladder or stepladder: Make sure that it is fully opened. Do not climb a closed ladder. Make sure the sides of the ladder are locked in place. Have someone hold the ladder while you use it. Know where your pets are as you move through your home. What can I do in the bathroom?     Keep the floor dry. Clean up any water  that is on the floor right away. Remove soap buildup in the bathtub or shower. Buildup makes bathtubs and showers slippery. Use non-skid mats or decals on the floor of the bathtub or shower. Attach bath mats securely with double-sided, non-slip rug tape. If you need to sit down while you are in the shower, use a non-slip stool. Install grab bars by the toilet and in the bathtub and shower. Do not use towel bars as grab bars. What can I do in the bedroom? Make sure that you have a light by your bed that is easy to reach. Do not use any sheets or blankets on your bed that hang to the floor. Have a firm bench or chair with side arms that you can use for support when you get dressed. What can I do in the kitchen? Clean up any spills right away. If you need to reach something above you, use a sturdy step stool that has a grab bar. Keep electrical cables out of the way. Do not use floor polish or wax that makes floors slippery. What can I do with my stairs? Do not leave anything on the stairs. Make sure that you have a light switch at the top and the bottom of the stairs. Have them installed if you do not have them. Make sure that there are handrails on both sides of the stairs. Fix handrails that are broken or loose. Make sure that handrails are as long as  the staircases. Install non-slip stair treads on all stairs in your home if they do not have carpet. Avoid having throw rugs at the top or bottom of stairs, or secure the rugs with carpet tape to prevent them from moving. Choose a carpet design that does not hide the edge of steps on the stairs. Make sure that carpet is firmly attached to the stairs. Fix any carpet that is loose or worn. What can I do on the outside of my home? Use bright outdoor lighting. Repair the edges of walkways and driveways and fix any cracks. Clear paths of anything that can make you trip, such as tools or rocks. Add color or contrast paint or tape to clearly mark and help you see high doorway thresholds. Trim any bushes or trees on the main path into your home. Check that handrails are securely fastened and in good repair. Both sides of all steps should have handrails.  Install guardrails along the edges of any raised decks or porches. Have leaves, snow, and ice cleared regularly. Use sand, salt, or ice melt on walkways during winter months if you live where there is ice and snow. In the garage, clean up any spills right away, including grease or oil spills. What other actions can I take? Review your medicines with your health care provider. Some medicines can make you confused or feel dizzy. This can increase your chance of falling. Wear closed-toe shoes that fit well and support your feet. Wear shoes that have rubber soles and low heels. Use a cane, walker, scooter, or crutches that help you move around if needed. Talk with your provider about other ways that you can decrease your risk of falls. This may include seeing a physical therapist to learn to do exercises to improve movement and strength. Where to find more information Centers for Disease Control and Prevention, STEADI: TonerPromos.no [employer] on Aging: BaseRingTones.pl National Institute on Aging: BaseRingTones.pl Contact a health care provider if: You are afraid  of falling at home. You feel weak, drowsy, or dizzy at home. You fall at home. Get help right away if you: Lose consciousness or have trouble moving after a fall. Have a fall that causes a head injury. These symptoms may be an emergency. Get help right away. Call 911. Do not wait to see if the symptoms will go away. Do not drive yourself to the hospital. This information is not intended to replace advice given to you by your health care provider. Make sure you discuss any questions you have with your health care provider. Document Revised: 02/18/2022 Document Reviewed: 02/18/2022 Elsevier Patient Education  2024 [employer].

## 2024-03-17 NOTE — Progress Notes (Signed)
 Subjective:   Erin Donovan is a 68 y.o. who presents for a Medicare Wellness preventive visit.  As a reminder, Annual Wellness Visits don't include a physical exam, and some assessments may be limited, especially if this visit is performed virtually. We may recommend an in-person follow-up visit with your provider if needed.  Visit Complete: Virtual I connected with  Erin Donovan on 03/17/24 by a audio enabled telemedicine application and verified that I am speaking with the correct person using two identifiers.  Patient Location: Home  Provider Location: Home Office  I discussed the limitations of evaluation and management by telemedicine. The patient expressed understanding and agreed to proceed.  Vital Signs: Because this visit was a virtual/telehealth visit, some criteria may be missing or patient reported. Any vitals not documented were not able to be obtained and vitals that have been documented are patient reported.  VideoDeclined- This patient declined Librarian, academic. Therefore the visit was completed with audio only.  Persons Participating in Visit: Patient.  AWV Questionnaire: Yes: Patient Medicare AWV questionnaire was completed by the patient on 03/10/24; I have confirmed that all information answered by patient is correct and no changes since this date.  Cardiac Risk Factors include: advanced age (>24men, >22 women)     Objective:    Today's Vitals   03/17/24 1440  Weight: 147 lb (66.7 kg)  Height: 5' 4.5 (1.638 m)   Body mass index is 24.84 kg/m.     03/17/2024    2:54 PM 04/02/2023    1:27 PM 01/08/2023    9:45 AM 01/02/2019   10:17 AM 12/05/2016   10:10 AM 09/17/2016    9:55 PM 09/06/2016   11:22 AM  Advanced Directives  Does Patient Have a Medical Advance Directive? No Yes Yes No Yes  No  No   Type of Best boy of [place];Living will  Healthcare Power of Attorney    Does patient want to make  changes to medical advance directive?   No - Patient declined  No - Patient declined     Copy of Healthcare Power of Attorney in Chart?   No - copy requested  No - copy requested     Would patient like information on creating a medical advance directive? Yes (MAU/Ambulatory/Procedural Areas - Information given)     Yes (Inpatient - patient defers creating a medical advance directive at this time)  No - Patient declined      Data saved with a previous flowsheet row definition    Current Medications (verified) Outpatient Encounter Medications as of 03/17/2024  Medication Sig   cetirizine  (ZYRTEC ) 10 MG tablet Take 1 tablet (10 mg total) by mouth as needed for allergies. otc (Patient taking differently: Take 10 mg by mouth as needed for allergies. Otc Taking 1 tablet daily)   Cholecalciferol (VITAMIN D3) 5000 units CAPS Take 1 capsule (5,000 Units total) by mouth daily.   fluticasone  (FLONASE ) 50 MCG/ACT nasal spray Place 2 sprays into both nostrils daily.   magnesium  oxide (MAG-OX) 400 MG tablet Take 400 mg by mouth daily.   meloxicam  (MOBIC ) 15 MG tablet Take 1 tablet (15 mg total) by mouth daily. Weiner/ ortho (Patient taking differently: Take 15 mg by mouth daily. Weiner/ ortho PRN)   topiramate (TOPAMAX) 25 MG tablet Take 25 mg by mouth. (Patient taking differently: Take 25 mg by mouth. 1 tablet in the morning and 2 tablets in the evening)   Zinc 50  MG CAPS Take 1 capsule by mouth daily.   No facility-administered encounter medications on file as of 03/17/2024.    Allergies (verified) No known allergies   History: Past Medical History:  Diagnosis Date   Arthritis, rheumatoid (HCC) 11/2013   Basal cell carcinoma of skin of face    right side   Chronic lumbar pain    broke L1 last year (09/17/2016)   Dental crowns present    2 implants - right upper - tooth 4 & 5   Depression    single episode   Family history of ovarian cancer 01/03/2014   genetic letter sent   History of  mammogram 07/30/2010   neg/cat 2   Osteoarthritis    hips (09/17/2016)   Osteoporosis 2017   c compression fracture of spine   Osteosclerosis    Right hip pain    Weight gain    Past Surgical History:  Procedure Laterality Date   BASAL CELL CARCINOMA EXCISION  1990s   right side of my face   COLONOSCOPY WITH PROPOFOL  N/A 12/05/2016   Procedure: COLONOSCOPY WITH PROPOFOL ;  Surgeon: Jinny Carmine, MD;  Location: [facility];  Service: Endoscopy;  Laterality: N/A;   JOINT REPLACEMENT     POLYPECTOMY N/A 12/05/2016   Procedure: POLYPECTOMY;  Surgeon: Jinny Carmine, MD;  Location: [facility];  Service: Endoscopy;  Laterality: N/A;   TOTAL HIP ARTHROPLASTY Right 09/17/2016   TOTAL HIP ARTHROPLASTY Right 09/17/2016   Procedure: RIGHT TOTAL HIP ARTHROPLASTY ANTERIOR APPROACH;  Surgeon: Evalene JONETTA Chancy, MD;  Location: [facility];  Service: Orthopedics;  Laterality: Right;   Family History  Problem Relation Age of Onset   Hypertension Mother    Heart failure Mother    Heart disease Father    Stroke Sister 30   Cervical cancer Sister    Breast cancer Sister 55   Other Sister        Vulva Cancer   Diabetes Other    Hypertension Other    Osteoporosis Other    Social History   Socioeconomic History   Marital status: Married    Spouse name: Ozell   Number of children: 1   Years of education: Not on file   Highest education level: 12th grade  Occupational History   Occupation: retired  Tobacco Use   Smoking status: Former    Current packs/day: 0.00    Average packs/day: 1 pack/day for 46.0 years (46.0 ttl pk-yrs)    Types: Cigarettes    Start date: 06/06/1973    Quit date: 06/07/2019    Years since quitting: 4.7   Smokeless tobacco: Never   Tobacco comments:    startedage 16- gave info on patches and pills  Vaping Use   Vaping status: Never Used  Substance and Sexual Activity   Alcohol use: No   Drug use: No   Sexual activity: Yes    Birth control/protection:  Post-menopausal  Other Topics Concern   Not on file  Social History Narrative   Not on file   Social Drivers of Health   Financial Resource Strain: Low Risk  (03/17/2024)   Overall Financial Resource Strain (CARDIA)    Difficulty of Paying Living Expenses: Not hard at all  Food Insecurity: No Food Insecurity (03/17/2024)   Hunger Vital Sign    Worried About Running Out of Food in the Last Year: Never true    Ran Out of Food in the Last Year: Never true  Transportation Needs: No Transportation Needs (  03/17/2024)   PRAPARE - Administrator, Civil Service (Medical): No    Lack of Transportation (Non-Medical): No  Physical Activity: Sufficiently Active (03/17/2024)   Exercise Vital Sign    Days of Exercise per Week: 5 days    Minutes of Exercise per Session: 60 min  Stress: No Stress Concern Present (03/17/2024)   [employer] of Occupational Health - Occupational Stress Questionnaire    Feeling of Stress: Not at all  Social Connections: Moderately Integrated (03/17/2024)   Social Connection and Isolation Panel    Frequency of Communication with Friends and Family: More than three times a week    Frequency of Social Gatherings with Friends and Family: More than three times a week    Attends Religious Services: More than 4 times per year    Active Member of [employer] or Organizations: No    Attends Banker Meetings: Never    Marital Status: Married    Tobacco Counseling Counseling given: Not Answered Tobacco comments: startedage 16- gave info on patches and pills    Clinical Intake:  Pre-visit preparation completed: Yes  Pain : No/denies pain     BMI - recorded: 24.84 Nutritional Status: BMI of 19-24  Normal Nutritional Risks: None Diabetes: No  Lab Results  Component Value Date   HGBA1C 5.3 10/21/2019     How often do you need to have someone help you when you read instructions, pamphlets, or other written materials from your doctor or  pharmacy?: 1 - Never  Interpreter Needed?: No  Information entered by :: Vina Ned, CMA   Activities of Daily Living     03/17/2024    2:42 PM 03/10/2024    9:38 AM  In your present state of health, do you have any difficulty performing the following activities:  Hearing? 1 1  Comment placed referral to [facility]   Vision? 0 0  Difficulty concentrating or making decisions? 0 0  Walking or climbing stairs? 0 0  Dressing or bathing? 0 0  Doing errands, shopping? 0 0  Preparing Food and eating ? N N  Using the Toilet? N N  In the past six months, have you accidently leaked urine? N N  Do you have problems with loss of bowel control? N N  Managing your Medications? N N  Managing your Finances? N N  Housekeeping or managing your Housekeeping? N N    Patient Care Team: Lemon Raisin, MD as PCP - General (Internal Medicine) Laurice Francis NOVAK, OD (Optometry) Gospe, Donna Meadows III, MD as Referring Physician (Ophthalmology)  I have updated your Care Teams any recent Medical Services you may have received from other providers in the past year.     Assessment:   This is a routine wellness examination for Erin Donovan.  Hearing/Vision screen Hearing Screening - Comments:: Pt reported bilateral hearing loss. Referral placed to [facility] for evaluation. Vision Screening - Comments:: Gets routine eye exams, Dr Francis Laurice, [place]    Goals Addressed             This Visit's Progress    Patient Stated       Lose weight. would like to get to 140lb. Would like to get IH in full remission       Depression Screen     03/17/2024    2:51 PM 07/08/2023    9:37 AM 04/02/2023    1:27 PM 01/08/2023    9:47 AM 01/08/2023    9:44 AM  06/17/2022    3:40 PM 06/07/2021    8:33 AM  PHQ 2/9 Scores  PHQ - 2 Score 0 0 0 0 0 0 0  PHQ- 9 Score 0 0    0 0    Fall Risk     03/17/2024    2:56 PM 03/10/2024    9:38 AM 01/20/2024    8:31 PM 07/08/2023    9:37 AM 04/02/2023    1:27 PM  Fall  Risk   Falls in the past year? 0 0 0 0 0  Number falls in past yr: 0   0   Injury with Fall? 0      Risk for fall due to : No Fall Risks   No Fall Risks   Follow up Falls evaluation completed   Falls evaluation completed     MEDICARE RISK AT HOME:  Medicare Risk at Home Any stairs in or around the home?: Yes If so, are there any without handrails?: No Home free of loose throw rugs in walkways, pet beds, electrical cords, etc?: Yes Adequate lighting in your home to reduce risk of falls?: Yes Life alert?: No Use of a cane, walker or w/c?: No Grab bars in the bathroom?: No Shower chair or bench in shower?: No Elevated toilet seat or a handicapped toilet?: No  TIMED UP AND GO:  Was the test performed?  No  Cognitive Function: 6CIT completed        03/17/2024    2:57 PM 01/08/2023    9:47 AM  6CIT Screen  What Year? 0 points 0 points  What month? 0 points 0 points  What time? 0 points 0 points  Count back from 20 0 points 0 points  Months in reverse 0 points 0 points  Repeat phrase 0 points 0 points  Total Score 0 points 0 points    Immunizations Immunization History  Administered Date(s) Administered   Fluad Trivalent(High Dose 65+) 07/08/2023   Influenza,inj,Quad PF,6+ Mos 07/21/2018, 07/01/2019, 06/20/2020, 06/07/2021   Influenza-Unspecified 04/09/2017, 06/17/2022   Janssen (J&J) SARS-COV-2 Vaccination 02/18/2020   PNEUMOCOCCAL CONJUGATE-20 07/08/2023   Tdap 07/31/2016    Screening Tests Health Maintenance  Topic Date Due   Zoster Vaccines- Shingrix (1 of 2) Never done   Lung Cancer Screening  Never done   COVID-19 Vaccine (2 - Janssen risk series) 03/17/2020   Influenza Vaccine  01/30/2024   Mammogram  07/03/2024   DEXA SCAN  11/11/2024   Medicare Annual Wellness (AWV)  03/17/2025   DTaP/Tdap/Td (2 - Td or Tdap) 07/31/2026   Colonoscopy  12/06/2026   Pneumococcal Vaccine: 50+ Years  Completed   Hepatitis C Screening  Completed   HPV VACCINES  Aged Out    Meningococcal B Vaccine  Aged Out    Health Maintenance Items Addressed: Mammogram ordered, Referral sent to [facility] (smoker/hx smoking), See Nurse Notes at the end of this note  Additional Screening:  Vision Screening: Recommended annual ophthalmology exams for early detection of glaucoma and other disorders of the eye. Is the patient up to date with their annual eye exam?  Yes  Who is the provider or what is the name of the office in which the patient attends annual eye exams? Dr. Francis Mallick [place] and Dr. Donna Eden @ [facility]  Dental Screening: Recommended annual dental exams for proper oral hygiene  Community Resource Referral / Chronic Care Management: CRR required this visit?  No   CCM required this visit?  No  Plan:    I have personally reviewed and noted the following in the patient's chart:   Medical and social history Use of alcohol, tobacco or illicit drugs  Current medications and supplements including opioid prescriptions. Patient is not currently taking opioid prescriptions. Functional ability and status Nutritional status Physical activity Advanced directives List of other physicians Hospitalizations, surgeries, and ER visits in previous 12 months Vitals Screenings to include cognitive, depression, and falls Referrals and appointments  In addition, I have reviewed and discussed with patient certain preventive protocols, quality metrics, and best practice recommendations. A written personalized care plan for preventive services as well as general preventive health recommendations were provided to patient.   Vina Ned, CMA   03/17/2024   After Visit Summary: (MyChart) Due to this being a telephonic visit, the after visit summary with patients personalized plan was offered to patient via MyChart   Notes:  Scheduled transfer of care appt for 04/20/24 (prev Jones pt) Placed referral to [facility] for pt reported bilateral hearing  loss Placed referral to [facility] for lung cancer screening Placed order for a MMG Will get flu vaccine at next OV Declined covid and shingles vaccines

## 2024-03-23 DIAGNOSIS — H6983 Other specified disorders of Eustachian tube, bilateral: Secondary | ICD-10-CM | POA: Diagnosis not present

## 2024-03-23 DIAGNOSIS — H90A31 Mixed conductive and sensorineural hearing loss, unilateral, right ear with restricted hearing on the contralateral side: Secondary | ICD-10-CM | POA: Diagnosis not present

## 2024-04-01 ENCOUNTER — Telehealth: Payer: Self-pay | Admitting: Acute Care

## 2024-04-01 DIAGNOSIS — Z122 Encounter for screening for malignant neoplasm of respiratory organs: Secondary | ICD-10-CM

## 2024-04-01 DIAGNOSIS — Z87891 Personal history of nicotine dependence: Secondary | ICD-10-CM

## 2024-04-01 NOTE — Telephone Encounter (Signed)
 Lung Cancer Screening Narrative/Criteria Questionnaire (Cigarette Smokers Only- No Cigars/Pipes/vapes)   Erin Donovan   SDMV:04/12/2024 1:30p [name]       10-27-1955   LDCT: 04/13/2024 1:30 OPIC    68 y.o.   Phone: [phone]  Lung Screening Narrative (confirm age 11-77 yrs Medicare / 50-80 yrs Private pay insurance)   Insurance information:BCBS mcr   Referring Provider:Dr. Lemon   This screening involves an initial phone call with a team member from our program. It is called a shared decision making visit. The initial meeting is required by  insurance and Medicare to make sure you understand the program. This appointment takes about 15-20 minutes to complete. You will complete the screening scan at your scheduled date/time.  This scan takes about 5-10 minutes to complete. You can eat and drink normally before and after the scan.  Criteria questions for Lung Cancer Screening:   Are you a current or former smoker? Former Age began smoking: 68yo   If you are a former smoker, what year did you quit smoking? 2020(within 15 yrs)   To calculate your smoking history, I need an accurate estimate of how many packs of cigarettes you smoked per day and for how many years. (Not just the number of PPD you are now smoking)   Years smoking 52 x Packs per day 3/4 = Pack years 39   (at least 20 pack yrs)   (Make sure they understand that we need to know how much they have smoked in the past, not just the number of PPD they are smoking now)  Do you have a personal history of cancer?  No    Do you have a family history of cancer? Yes  (cancer type and and relative) sister #1 - cervical, sister #2 - breast  Are you coughing up blood?  No  Have you had unexplained weight loss of 15 lbs or more in the last 6 months? No  It looks like you meet all criteria.  When would be a good time for us  to schedule you for this screening?   Additional information: N/A

## 2024-04-12 ENCOUNTER — Encounter: Payer: Self-pay | Admitting: *Deleted

## 2024-04-12 ENCOUNTER — Ambulatory Visit: Admitting: *Deleted

## 2024-04-12 DIAGNOSIS — Z87891 Personal history of nicotine dependence: Secondary | ICD-10-CM | POA: Diagnosis not present

## 2024-04-12 NOTE — Progress Notes (Signed)
  Virtual Visit via Telephone Note  I connected with Erin Donovan on 04/12/24 at  1:30 PM EDT by telephone and verified that I am speaking with the correct person using two identifiers.  Location: Patient: in home Provider: [address] [place], [place], [address]    Shared Decision Making Visit Lung Cancer Screening Program [phone])   Eligibility: Age 68 y.o. Pack Years Smoking History Calculation 39 (# packs/per year x # years smoked) Recent History of coughing up blood  no Unexplained weight loss? no ( >Than 15 pounds within the last 6 months ) Prior History Lung / other cancer no (Diagnosis within the last 5 years already requiring surveillance chest CT Scans). Smoking Status Former Smoker Former Smokers: Years since quit: 4 years  Quit Date: 06-07-19  Visit Components: Discussion included one or more decision making aids. yes Discussion included risk/benefits of screening. yes Discussion included potential follow up diagnostic testing for abnormal scans. yes Discussion included meaning and risk of over diagnosis. yes Discussion included meaning and risk of False Positives. yes Discussion included meaning of total radiation exposure. yes  Counseling Included: Importance of adherence to annual lung cancer LDCT screening. yes Impact of comorbidities on ability to participate in the program. yes Ability and willingness to under diagnostic treatment. yes  Smoking Cessation Counseling: Current Smokers:  Discussed importance of smoking cessation. yes Information about tobacco cessation classes and interventions provided to patient. yes Patient provided with ticket for LDCT Scan. yes Symptomatic Patient. no  Counseling NA Diagnosis Code: Tobacco Use Z72.0 Asymptomatic Patient yes  Counseling (Intermediate counseling: > three minutes counseling) [id number]  Counseled patient 4 minutes regarding tobacco use.   Former Smokers:  Discussed the importance of  maintaining cigarette abstinence. yes Diagnosis Code: Personal History of Nicotine Dependence. S12.108 Information about tobacco cessation classes and interventions provided to patient. Yes Patient provided with ticket for LDCT Scan. yes Written Order for Lung Cancer Screening with LDCT placed in Epic. Yes (CT Chest Lung Cancer Screening Low Dose W/O CM) [id number] Z12.2-Screening of respiratory organs Z87.891-Personal history of nicotine dependence   Josette Ranger, RN 04/12/24

## 2024-04-12 NOTE — Patient Instructions (Signed)

## 2024-04-13 ENCOUNTER — Ambulatory Visit
Admission: RE | Admit: 2024-04-13 | Discharge: 2024-04-13 | Disposition: A | Discharge status: Home / Self Care | Source: Ambulatory Visit | Attending: Acute Care | Admitting: Acute Care

## 2024-04-13 DIAGNOSIS — Z87891 Personal history of nicotine dependence: Secondary | ICD-10-CM | POA: Insufficient documentation

## 2024-04-13 DIAGNOSIS — Z122 Encounter for screening for malignant neoplasm of respiratory organs: Secondary | ICD-10-CM | POA: Diagnosis not present

## 2024-04-16 ENCOUNTER — Telehealth: Payer: Self-pay

## 2024-04-16 ENCOUNTER — Telehealth: Payer: Self-pay | Admitting: Acute Care

## 2024-04-16 NOTE — Telephone Encounter (Signed)
 Copied from CRM [id number]. Topic: Clinical - Lab/Test Results >> Apr 16, 2024 11:36 AM Rilla NOVAK wrote: Reason for CRM: [facility] radiology calling to make sure results were seen.  Please call [phone] for call report.  Call report from [facility] radiology:  IMPRESSION: 1. Lung-RADS 0, incomplete. Additional lung cancer screening CT images/or comparison to prior chest CT examinations is needed. Ground-glass and ill-defined opacity in the left lower lobe with nodular components, suggestive of an infectious or inflammatory process. Recommend follow-up lung cancer screening CT in 1-3 months to assess for resolution or change. 2. Coronary artery calcifications. 3. Aortic Atherosclerosis (ICD10-I70.0) and Emphysema (ICD10-J43.9).

## 2024-04-16 NOTE — Telephone Encounter (Signed)
 Called the patient. She has not been sick. No fever, discolored secretions, worsening dyspnea than her baseline. Plan will be for a 2 month follow up scan due 05/2024. Please let her PCP know. Thanks so much

## 2024-04-16 NOTE — Telephone Encounter (Signed)
 Spoke with Tiffany, verified report was received.

## 2024-04-19 ENCOUNTER — Other Ambulatory Visit: Payer: Self-pay

## 2024-04-19 DIAGNOSIS — Z122 Encounter for screening for malignant neoplasm of respiratory organs: Secondary | ICD-10-CM

## 2024-04-19 DIAGNOSIS — R911 Solitary pulmonary nodule: Secondary | ICD-10-CM

## 2024-04-19 DIAGNOSIS — Z87891 Personal history of nicotine dependence: Secondary | ICD-10-CM

## 2024-04-19 NOTE — Telephone Encounter (Signed)
 Order placed and results/plan to PCP.

## 2024-04-19 NOTE — Telephone Encounter (Signed)
 Provider spoke with the patient 04/16/2024

## 2024-04-20 ENCOUNTER — Encounter: Payer: Self-pay | Admitting: Student

## 2024-04-20 ENCOUNTER — Ambulatory Visit (INDEPENDENT_AMBULATORY_CARE_PROVIDER_SITE_OTHER): Admitting: Student

## 2024-04-20 VITALS — BP 118/68 | HR 56 | Ht 64.5 in | Wt 151.0 lb

## 2024-04-20 DIAGNOSIS — R911 Solitary pulmonary nodule: Secondary | ICD-10-CM

## 2024-04-20 DIAGNOSIS — I7090 Unspecified atherosclerosis: Secondary | ICD-10-CM | POA: Diagnosis not present

## 2024-04-20 DIAGNOSIS — J439 Emphysema, unspecified: Secondary | ICD-10-CM

## 2024-04-20 DIAGNOSIS — M810 Age-related osteoporosis without current pathological fracture: Secondary | ICD-10-CM

## 2024-04-20 DIAGNOSIS — M545 Low back pain, unspecified: Secondary | ICD-10-CM | POA: Insufficient documentation

## 2024-04-20 DIAGNOSIS — Z23 Encounter for immunization: Secondary | ICD-10-CM | POA: Diagnosis not present

## 2024-04-20 DIAGNOSIS — R300 Dysuria: Secondary | ICD-10-CM

## 2024-04-20 DIAGNOSIS — M5442 Lumbago with sciatica, left side: Secondary | ICD-10-CM

## 2024-04-20 DIAGNOSIS — G932 Benign intracranial hypertension: Secondary | ICD-10-CM | POA: Diagnosis not present

## 2024-04-20 DIAGNOSIS — J302 Other seasonal allergic rhinitis: Secondary | ICD-10-CM

## 2024-04-20 DIAGNOSIS — G8929 Other chronic pain: Secondary | ICD-10-CM

## 2024-04-20 DIAGNOSIS — E7800 Pure hypercholesterolemia, unspecified: Secondary | ICD-10-CM

## 2024-04-20 MED ORDER — TRELEGY ELLIPTA 100-62.5-25 MCG/ACT IN AEPB: 1.0000 | INHALATION_SPRAY | Freq: Every day | RESPIRATORY_TRACT | 11 refills | Status: DC

## 2024-04-20 NOTE — Progress Notes (Unsigned)
 Established Patient Office Visit  Subjective   Patient ID: Erin Donovan, female    DOB: [birth year]  Age: 68 y.o. MRN: [id number]  Chief Complaint  Patient presents with   Establish Care    Patient is here to establish care with new PCP   Urinary Tract Infection    Took an at home AZO/ UTI test, came back positive    Malary A Galer with medical hx listed below presents today for transfer of care.  Burning with urination for hte past 2-3 days, worse yesterday but improved today. Feels she urinating more frequently than usual. Took a home AZO and reprots postive for leukocytes and negative for nitrites. Denies fever, chills, flank pain, hematuria.   Patient Active Problem List   Diagnosis Date Noted   Dysuria 04/23/2024   HLD (hyperlipidemia) 04/23/2024   Lung nodule 04/23/2024   Emphysema lung (HCC) 04/23/2024   IIH (idiopathic intracranial hypertension) 04/20/2024   Low back pain 04/20/2024   Polyp of sigmoid colon    Osteoarthritis 09/17/2016   Allergic rhinitis 08/27/2016   Age-related osteoporosis without current pathological fracture 07/31/2016   Hx of compression fracture of spine 07/31/2016      ROS Refer to HPI    Objective:     Outpatient Encounter Medications as of 04/20/2024  Medication Sig   cetirizine  (ZYRTEC ) 10 MG tablet Take 1 tablet (10 mg total) by mouth as needed for allergies. otc (Patient taking differently: Take 10 mg by mouth as needed for allergies. Otc Taking 1 tablet daily)   Cholecalciferol (VITAMIN D3) 5000 units CAPS Take 1 capsule (5,000 Units total) by mouth daily.   fluticasone  (FLONASE ) 50 MCG/ACT nasal spray Place 2 sprays into both nostrils daily.   Fluticasone -Umeclidin-Vilant (TRELEGY ELLIPTA) 100-62.5-25 MCG/ACT AEPB Inhale 1 puff into the lungs daily.   magnesium  oxide (MAG-OX) 400 MG tablet Take 400 mg by mouth daily.   meloxicam  (MOBIC ) 15 MG tablet Take 1 tablet (15 mg total) by mouth daily. Weiner/ ortho (Patient taking  differently: Take 15 mg by mouth daily. Weiner/ ortho PRN)   topiramate (TOPAMAX) 25 MG tablet Take 25 mg by mouth. (Patient taking differently: Take 25 mg by mouth. 1 tablet in the morning and 2 tablets in the evening)   Zinc 50 MG CAPS Take 1 capsule by mouth daily.   No facility-administered encounter medications on file as of 04/20/2024.    BP 118/68   Pulse (!) 56   Ht 5' 4.5 (1.638 m)   Wt 151 lb (68.5 kg)   SpO2 97%   BMI 25.52 kg/m  BP Readings from Last 3 Encounters:  04/20/24 118/68  07/08/23 122/76  03/25/23 128/68    Physical Exam Constitutional:      Appearance: Normal appearance.  Eyes:     Extraocular Movements: Extraocular movements intact.     Pupils: Pupils are equal, round, and reactive to light.  Cardiovascular:     Rate and Rhythm: Normal rate and regular rhythm.  Pulmonary:     Effort: Pulmonary effort is normal.     Breath sounds: No rhonchi or rales.  Abdominal:     General: Abdomen is flat. Bowel sounds are normal. There is no distension.     Palpations: Abdomen is soft.     Tenderness: There is no abdominal tenderness. There is no right CVA tenderness, left CVA tenderness or guarding.  Musculoskeletal:        General: Normal range of motion.     Right lower  leg: No edema.     Left lower leg: No edema.  Skin:    General: Skin is warm and dry.     Capillary Refill: Capillary refill takes less than 2 seconds.  Neurological:     General: No focal deficit present.     Mental Status: She is alert and oriented to person, place, and time.  Psychiatric:        Mood and Affect: Mood normal.        Behavior: Behavior normal.        04/20/2024    2:23 PM 03/17/2024    2:51 PM 07/08/2023    9:37 AM  Depression screen PHQ 2/9  Decreased Interest 0 0 0  Down, Depressed, Hopeless 0 0 0  PHQ - 2 Score 0 0 0  Altered sleeping 0 0 0  Tired, decreased energy 0 0 0  Change in appetite 0 0 0  Feeling bad or failure about yourself  0 0 0  Trouble  concentrating 0 0 0  Moving slowly or fidgety/restless 0 0 0  Suicidal thoughts 0 0 0  PHQ-9 Score 0 0 0  Difficult doing work/chores Not difficult at all Not difficult at all Not difficult at all       04/20/2024    2:23 PM 07/08/2023    9:37 AM 06/17/2022    3:40 PM 06/07/2021    8:33 AM  GAD 7 : Generalized Anxiety Score  Nervous, Anxious, on Edge 0 0 0 0  Control/stop worrying 0 0 0 0  Worry too much - different things 0 0 0 0  Trouble relaxing 0 0 0 0  Restless 0 0 0 0  Easily annoyed or irritable 0 0 0 0  Afraid - awful might happen 0 0 0 0  Total GAD 7 Score 0 0 0 0  Anxiety Difficulty Not difficult at all Not difficult at all Not difficult at all     Results for orders placed or performed in visit on 04/20/24  Urine Culture   Specimen: Urine   Urine  Result Value Ref Range   Urine Culture, Routine Final report    Organism ID, Bacteria Comment   Urinalysis, Routine w reflex microscopic  Result Value Ref Range   Specific Gravity, UA 1.005 1.005 - 1.030   pH, UA 7.0 5.0 - 7.5   Color, UA Yellow Yellow   Appearance Ur Clear Clear   Leukocytes,UA Negative Negative   Protein,UA Negative Negative/Trace   Glucose, UA Negative Negative   Ketones, UA Negative Negative   RBC, UA Negative Negative   Bilirubin, UA Negative Negative   Urobilinogen, Ur 0.2 0.2 - 1.0 mg/dL   Nitrite, UA Negative Negative   Microscopic Examination Comment   Vitamin D  (25 hydroxy)  Result Value Ref Range   Vit D, 25-Hydroxy 89.5 30.0 - 100.0 ng/mL  Lipid panel  Result Value Ref Range   Cholesterol, Total 215 (H) 100 - 199 mg/dL   Triglycerides 91 0 - 149 mg/dL   HDL 69 >60 mg/dL   VLDL Cholesterol Cal 16 5 - 40 mg/dL   LDL Chol Calc (NIH) 869 (H) 0 - 99 mg/dL   Chol/HDL Ratio 3.1 0.0 - 4.4 ratio  Comprehensive Metabolic Panel (CMET)  Result Value Ref Range   Glucose 94 70 - 99 mg/dL   BUN 13 8 - 27 mg/dL   Creatinine, Ser 8.98 (H) 0.57 - 1.00 mg/dL   eGFR 61 >40 fO/fpw/8.26    BUN/Creatinine  Ratio 13 12 - 28   Sodium 139 134 - 144 mmol/L   Potassium 4.3 3.5 - 5.2 mmol/L   Chloride 105 96 - 106 mmol/L   CO2 21 20 - 29 mmol/L   Calcium  9.4 8.7 - 10.3 mg/dL   Total Protein 6.8 6.0 - 8.5 g/dL   Albumin 4.5 3.9 - 4.9 g/dL   Globulin, Total 2.3 1.5 - 4.5 g/dL   Bilirubin Total 0.3 0.0 - 1.2 mg/dL   Alkaline Phosphatase 107 49 - 135 IU/L   AST 23 0 - 40 IU/L   ALT 20 0 - 32 IU/L  Specimen status report  Result Value Ref Range   specimen status report Comment   Specimen status report  Result Value Ref Range   specimen status report Comment   POCT Urinalysis Dipstick  Result Value Ref Range   Color, UA yellow    Clarity, UA clear    Glucose, UA Negative Negative   Bilirubin, UA negative    Ketones, UA negative    Spec Grav, UA 1.010 1.010 - 1.025   Blood, UA negative    pH, UA 6.5 5.0 - 8.0   Protein, UA Negative Negative   Urobilinogen, UA negative (A) 0.2 or 1.0 E.U./dL   Nitrite, UA negative    Leukocytes, UA Negative Negative   Appearance     Odor      Last CBC Lab Results  Component Value Date   WBC 6.3 10/21/2019   HGB 13.5 10/21/2019   HCT 41.2 10/21/2019   MCV 91 10/21/2019   MCH 29.7 10/21/2019   RDW 12.7 10/21/2019   PLT 245 10/21/2019   Last metabolic panel Lab Results  Component Value Date   GLUCOSE 94 04/20/2024   NA 139 04/20/2024   K 4.3 04/20/2024   CL 105 04/20/2024   CO2 21 04/20/2024   BUN 13 04/20/2024   CREATININE 1.01 (H) 04/20/2024   EGFR 61 04/20/2024   CALCIUM  9.4 04/20/2024   PHOS 4.0 10/21/2019   PROT 6.8 04/20/2024   ALBUMIN 4.5 04/20/2024   LABGLOB 2.3 04/20/2024   AGRATIO 2.0 06/25/2022   BILITOT 0.3 04/20/2024   ALKPHOS 107 04/20/2024   AST 23 04/20/2024   ALT 20 04/20/2024   ANIONGAP 6 09/06/2016   Last lipids Lab Results  Component Value Date   CHOL 215 (H) 04/20/2024   HDL 69 04/20/2024   LDLCALC 130 (H) 04/20/2024   TRIG 91 04/20/2024   CHOLHDL 3.1 04/20/2024   Last hemoglobin  A1c Lab Results  Component Value Date   HGBA1C 5.3 10/21/2019   Last thyroid  functions Lab Results  Component Value Date   TSH 0.627 07/31/2016      The 10-year ASCVD risk score (Arnett DK, et al., 2019) is: 5.7%    Assessment & Plan:  Dysuria Assessment & Plan: Dysuria for the last 1-2 days, normal urine dipstick. UA, and culture today.   Orders: -     POCT urinalysis dipstick -     Urine Culture -     Urinalysis, Routine w reflex microscopic  Encounter for immunization -     Flu vaccine HIGH DOSE PF(Fluzone Trivalent)  IIH (idiopathic intracranial hypertension) Assessment & Plan: Seeing ophthalmology on topamax which was recently increased.    Chronic left-sided low back pain with left-sided sciatica Assessment & Plan: Doing well with meloxicam . 2-7 tablets a times a month   Age-related osteoporosis without current pathological fracture Assessment & Plan: Currently on vitamin D3 supplementation.  Is not currently bisphosphate. Discussed this with her given hx of compression fx. Reports hx of difficulties with dentition requiring multiple dental implants although not in the last 2 years. Discuss trial of bisphosphate vs referral to endocrinology to discus alternative option. She will considers this but does not want to be on medication at this time. Vitamin D  today.   Orders: -     VITAMIN D  25 Hydroxy (Vit-D Deficiency, Fractures) -     Comprehensive metabolic panel with GFR  Atherosclerosis -     Lipid panel  Seasonal allergic rhinitis, unspecified trigger  Pure hypercholesterolemia Assessment & Plan: Atherosclerosis on low dose CT recently. Cholesterol has been elevated. Reports she eats a healthy diet. Repeat lipid panel. Discussed statin if elevated risk, she would like to trial red yeast and behavioral modification but is open to discussing statin if lipids remain high with behavioral changes.     Lung nodule Assessment & Plan: CT chest ordered she is  working to have this scheduled in ~2 months   Emphysema lung [facility]) Assessment & Plan: Noted on low dose CT in 03/2024. Quit smoking in 2020. Alpha antitrypsin today. Does have some DOE with stairs. Start on trelegy.   Other orders -     Trelegy Ellipta; Inhale 1 puff into the lungs daily.  Dispense: 1 each; Refill: 11 -     Specimen status report -     Specimen status report     Return in about 3 months (around 07/21/2024) for emphsema.    Harlene Saddler, MD

## 2024-04-20 NOTE — Assessment & Plan Note (Signed)
 Currently on vitamin D3. Is not currently

## 2024-04-20 NOTE — Assessment & Plan Note (Signed)
 Doing well with meloxicam . 2-7 tablets a times a month

## 2024-04-21 ENCOUNTER — Ambulatory Visit: Payer: Self-pay | Admitting: Student

## 2024-04-21 LAB — COMPREHENSIVE METABOLIC PANEL WITH GFR
ALT: 20 IU/L (ref 0–32)
AST: 23 IU/L (ref 0–40)
Albumin: 4.5 g/dL (ref 3.9–4.9)
Alkaline Phosphatase: 107 IU/L (ref 49–135)
BUN/Creatinine Ratio: 13 (ref 12–28)
BUN: 13 mg/dL (ref 8–27)
Bilirubin Total: 0.3 mg/dL (ref 0.0–1.2)
CO2: 21 mmol/L (ref 20–29)
Calcium: 9.4 mg/dL (ref 8.7–10.3)
Chloride: 105 mmol/L (ref 96–106)
Creatinine, Ser: 1.01 mg/dL — ABNORMAL HIGH (ref 0.57–1.00)
Globulin, Total: 2.3 g/dL (ref 1.5–4.5)
Glucose: 94 mg/dL (ref 70–99)
Potassium: 4.3 mmol/L (ref 3.5–5.2)
Sodium: 139 mmol/L (ref 134–144)
Total Protein: 6.8 g/dL (ref 6.0–8.5)
eGFR: 61 mL/min/1.73 (ref >59)

## 2024-04-21 LAB — POCT URINALYSIS DIPSTICK
Bilirubin, UA: NEGATIVE
Blood, UA: NEGATIVE
Glucose, UA: NEGATIVE
Ketones, UA: NEGATIVE
Leukocytes, UA: NEGATIVE
Nitrite, UA: NEGATIVE
Protein, UA: NEGATIVE
Spec Grav, UA: 1.01
Urobilinogen, UA: NEGATIVE U/dL — AB
pH, UA: 6.5

## 2024-04-21 LAB — URINALYSIS, ROUTINE W REFLEX MICROSCOPIC
Bilirubin, UA: NEGATIVE
Glucose, UA: NEGATIVE
Ketones, UA: NEGATIVE
Leukocytes,UA: NEGATIVE
Nitrite, UA: NEGATIVE
Protein,UA: NEGATIVE
RBC, UA: NEGATIVE
Specific Gravity, UA: 1.005 (ref 1.005–1.030)
Urobilinogen, Ur: 0.2 mg/dL (ref 0.2–1.0)
pH, UA: 7 (ref 5.0–7.5)

## 2024-04-21 LAB — LIPID PANEL
Chol/HDL Ratio: 3.1 ratio (ref 0.0–4.4)
Cholesterol, Total: 215 mg/dL — ABNORMAL HIGH (ref 100–199)
HDL: 69 mg/dL (ref >39)
LDL Chol Calc (NIH): 130 mg/dL — ABNORMAL HIGH (ref 0–99)
Triglycerides: 91 mg/dL (ref 0–149)
VLDL Cholesterol Cal: 16 mg/dL (ref 5–40)

## 2024-04-21 LAB — VITAMIN D 25 HYDROXY (VIT D DEFICIENCY, FRACTURES): Vit D, 25-Hydroxy: 89.5 ng/mL (ref 30.0–100.0)

## 2024-04-21 LAB — SPECIMEN STATUS REPORT

## 2024-04-22 NOTE — Telephone Encounter (Signed)
 Please review patient's message:

## 2024-04-23 ENCOUNTER — Encounter: Payer: Self-pay | Admitting: Student

## 2024-04-23 DIAGNOSIS — R300 Dysuria: Secondary | ICD-10-CM | POA: Insufficient documentation

## 2024-04-23 DIAGNOSIS — R911 Solitary pulmonary nodule: Secondary | ICD-10-CM | POA: Insufficient documentation

## 2024-04-23 DIAGNOSIS — E785 Hyperlipidemia, unspecified: Secondary | ICD-10-CM | POA: Insufficient documentation

## 2024-04-23 DIAGNOSIS — J439 Emphysema, unspecified: Secondary | ICD-10-CM | POA: Insufficient documentation

## 2024-04-23 LAB — SPECIMEN STATUS REPORT

## 2024-04-23 LAB — URINE CULTURE

## 2024-04-23 NOTE — Assessment & Plan Note (Signed)
 Dysuria for the last 1-2 days, normal urine dipstick. UA, and culture today.

## 2024-04-23 NOTE — Assessment & Plan Note (Addendum)
 Atherosclerosis on low dose CT recently. Cholesterol has been elevated. Reports she eats a healthy diet. Repeat lipid panel. Discussed statin if elevated risk, she would like to trial red yeast and behavioral modification but is open to discussing statin if lipids remain high with behavioral changes.

## 2024-04-23 NOTE — Assessment & Plan Note (Signed)
 Seeing ophthalmology on topamax which was recently increased.

## 2024-04-23 NOTE — Assessment & Plan Note (Signed)
 CT chest ordered she is working to have this scheduled in ~2 months

## 2024-04-23 NOTE — Assessment & Plan Note (Signed)
 Noted on low dose CT in 03/2024. Quit smoking in 2020. Alpha antitrypsin today. Does have some DOE with stairs. Start on trelegy.

## 2024-05-12 ENCOUNTER — Ambulatory Visit
Admission: RE | Admit: 2024-05-12 | Discharge: 2024-05-12 | Disposition: A | Discharge status: Home / Self Care | Source: Ambulatory Visit | Attending: Student | Admitting: Student

## 2024-05-12 DIAGNOSIS — Z1231 Encounter for screening mammogram for malignant neoplasm of breast: Secondary | ICD-10-CM | POA: Diagnosis not present

## 2024-05-13 ENCOUNTER — Other Ambulatory Visit: Payer: Self-pay | Admitting: *Deleted

## 2024-05-13 DIAGNOSIS — Z122 Encounter for screening for malignant neoplasm of respiratory organs: Secondary | ICD-10-CM

## 2024-05-13 DIAGNOSIS — Z87891 Personal history of nicotine dependence: Secondary | ICD-10-CM

## 2024-05-13 DIAGNOSIS — R911 Solitary pulmonary nodule: Secondary | ICD-10-CM

## 2024-06-01 ENCOUNTER — Ambulatory Visit: Admitting: Obstetrics

## 2024-06-01 ENCOUNTER — Encounter: Payer: Self-pay | Admitting: Obstetrics

## 2024-06-01 VITALS — BP 126/62 | HR 63 | Ht 64.5 in | Wt 148.0 lb

## 2024-06-01 DIAGNOSIS — N952 Postmenopausal atrophic vaginitis: Secondary | ICD-10-CM | POA: Insufficient documentation

## 2024-06-01 DIAGNOSIS — N9489 Other specified conditions associated with female genital organs and menstrual cycle: Secondary | ICD-10-CM | POA: Insufficient documentation

## 2024-06-01 MED ORDER — ESTRADIOL 0.01 % VA CREA: 1.0000 | TOPICAL_CREAM | Freq: Every day | VAGINAL | 1 refills | Status: AC

## 2024-06-01 NOTE — Patient Instructions (Signed)
 Start vaginal cream (or tablet) nightly for two weeks Then decrease to 3 times weekly If no improvement or worsening after 2wks, call my office Otherwise, continue the cream as above and see me in 3 mos

## 2024-06-01 NOTE — Progress Notes (Unsigned)
    GYNECOLOGY PROGRESS NOTE  Subjective:  PCP: Lemon Raisin, MD  Patient ID: Erin Donovan, female    DOB: 1955/09/05, 68 y.o.   MRN: [id number]  HPI  Patient is a 68 y.o. G35P1011 female who presents for vaginal burning, (feels like pins) for 1 month. It initially was noticebly only with urination and douching, uses Summer's Eve about 1 x month. Went to PCP on 10/21 for dysuria, urine culture and urine dip was normal. She has used monistat, that helped for a little bit, but not for long and is now worse. Pain is daily and random. Last seen here by Erin Donovan in Aug 2022 for her annual.   The following portions of the patient's history were reviewed and updated as appropriate: allergies, current medications, past family history, past medical history, past social history, past surgical history, and problem list.  Review of Systems Pertinent items are noted in HPI.   Objective:   Blood pressure 126/62, pulse 63, height 5' 4.5 (1.638 m), weight 148 lb (67.1 kg). Body mass index is 25.01 kg/m.  General appearance: alert and cooperative Abdomen: soft, non-tender; bowel sounds normal; no masses,  no organomegaly Pelvic: cervix normal in appearance, external genitalia normal, no adnexal masses or tenderness, no cervical motion tenderness, positive findings: vulvar/vaginal atrophy with reproducible pain upon palpation of the vaginal rugae in all directions, which are stenotic with decreased elasticity, rectovaginal septum normal, and uterus normal size, shape, and consistency Extremities: extremities normal, atraumatic, no cyanosis or edema Neurologic: Grossly normal   Assessment/Plan:   1. Post-menopausal atrophic vaginitis   2. Vaginal burning     68 y.o. G2P1011 with 25m of vaginal burning and pain, no hx of HRT or topical estrogen, and exam consistent with atrophic vaginitis.  -Start Estrace : Rx sent and SE and dosing reviewed -Call if no improvement or worsening after  2wks -Otherwise f/u in 3 mos   Estil Mangle, DO [facility]

## 2024-06-14 ENCOUNTER — Ambulatory Visit
Admission: RE | Admit: 2024-06-14 | Discharge: 2024-06-14 | Disposition: A | Source: Ambulatory Visit | Attending: Acute Care | Admitting: Acute Care

## 2024-06-14 ENCOUNTER — Encounter: Payer: Self-pay | Admitting: Obstetrics

## 2024-06-14 DIAGNOSIS — Z122 Encounter for screening for malignant neoplasm of respiratory organs: Secondary | ICD-10-CM | POA: Insufficient documentation

## 2024-06-14 DIAGNOSIS — Z87891 Personal history of nicotine dependence: Secondary | ICD-10-CM

## 2024-06-14 DIAGNOSIS — R911 Solitary pulmonary nodule: Secondary | ICD-10-CM | POA: Insufficient documentation

## 2024-06-18 ENCOUNTER — Other Ambulatory Visit: Payer: Self-pay

## 2024-06-18 DIAGNOSIS — F17210 Nicotine dependence, cigarettes, uncomplicated: Secondary | ICD-10-CM

## 2024-06-18 DIAGNOSIS — Z87891 Personal history of nicotine dependence: Secondary | ICD-10-CM

## 2024-06-18 DIAGNOSIS — Z122 Encounter for screening for malignant neoplasm of respiratory organs: Secondary | ICD-10-CM

## 2024-06-29 ENCOUNTER — Other Ambulatory Visit: Payer: Self-pay

## 2024-06-29 DIAGNOSIS — J302 Other seasonal allergic rhinitis: Secondary | ICD-10-CM

## 2024-06-29 DIAGNOSIS — M1611 Unilateral primary osteoarthritis, right hip: Secondary | ICD-10-CM

## 2024-07-02 MED ORDER — CETIRIZINE HCL 10 MG PO TABS: 10.0000 mg | ORAL_TABLET | ORAL | 3 refills | Status: AC | PRN

## 2024-07-02 MED ORDER — MELOXICAM 15 MG PO TABS: 15.0000 mg | ORAL_TABLET | Freq: Every day | ORAL | 11 refills | Status: AC | PRN

## 2024-07-20 ENCOUNTER — Encounter: Payer: Self-pay | Admitting: Student

## 2024-07-20 ENCOUNTER — Ambulatory Visit (INDEPENDENT_AMBULATORY_CARE_PROVIDER_SITE_OTHER): Admitting: Student

## 2024-07-20 VITALS — BP 118/68 | HR 63 | Ht 64.5 in | Wt 146.4 lb

## 2024-07-20 DIAGNOSIS — E7800 Pure hypercholesterolemia, unspecified: Secondary | ICD-10-CM | POA: Diagnosis not present

## 2024-07-20 DIAGNOSIS — J439 Emphysema, unspecified: Secondary | ICD-10-CM

## 2024-07-20 MED ORDER — ATORVASTATIN CALCIUM 10 MG PO TABS: 10.0000 mg | ORAL_TABLET | Freq: Every day | ORAL | 2 refills | Status: AC

## 2024-07-20 NOTE — Progress Notes (Signed)
 "  Established Patient Office Visit  Subjective   Patient ID: Erin Donovan, female    DOB: 1956-06-24  Age: 69 y.o. MRN: [id number]  Chief Complaint  Patient presents with   Emphysema    Erin Donovan is a 69 y.o. person with medical hx listed below who presents today for follow up of emphysema.  Please refer to problem based charting for further details and assessment and plan of current problem and chronic medical conditions.   Patient Active Problem List   Diagnosis Date Noted   Post-menopausal atrophic vaginitis 06/01/2024   HLD (hyperlipidemia) 04/23/2024   Lung nodule 04/23/2024   Emphysema lung (HCC) 04/23/2024   IIH (idiopathic intracranial hypertension) 04/20/2024   Low back pain 04/20/2024   Polyp of sigmoid colon    Osteoarthritis 09/17/2016   Allergic rhinitis 08/27/2016   Age-related osteoporosis without current pathological fracture 07/31/2016   Hx of compression fracture of spine 07/31/2016      ROS Refer to HPI    Objective:     Outpatient Encounter Medications as of 07/20/2024  Medication Sig   atorvastatin  (LIPITOR) 10 MG tablet Take 1 tablet (10 mg total) by mouth daily.   cetirizine  (ZYRTEC ) 10 MG tablet Take 1 tablet (10 mg total) by mouth as needed for allergies. otc   Cholecalciferol (VITAMIN D3) 5000 units CAPS Take 1 capsule (5,000 Units total) by mouth daily.   estradiol  (ESTRACE ) 0.01 % CREA vaginal cream Place 1 Applicatorful vaginally at bedtime. Insert nightly for first two weeks. Then decrease to 3 x week.   fluticasone  (FLONASE ) 50 MCG/ACT nasal spray Place 2 sprays into both nostrils daily.   magnesium  oxide (MAG-OX) 400 MG tablet Take 400 mg by mouth daily.   meloxicam  (MOBIC ) 15 MG tablet Take 1 tablet (15 mg total) by mouth daily as needed for pain. Weiner/ ortho   topiramate (TOPAMAX) 25 MG tablet Take 25 mg by mouth. (Patient taking differently: Take 25 mg by mouth. 1 tablet in the morning and 2 tablets in the evening)   Zinc 50  MG CAPS Take 1 capsule by mouth daily.   [DISCONTINUED] Fluticasone -Umeclidin-Vilant (TRELEGY ELLIPTA ) 100-62.5-25 MCG/ACT AEPB Inhale 1 puff into the lungs daily.   No facility-administered encounter medications on file as of 07/20/2024.    BP 118/68   Pulse 63   Ht 5' 4.5 (1.638 m)   Wt 146 lb 6 oz (66.4 kg)   SpO2 98%   BMI 24.74 kg/m  BP Readings from Last 3 Encounters:  07/20/24 118/68  06/01/24 126/62  04/20/24 118/68    Physical Exam Constitutional:      Appearance: Normal appearance.  HENT:     Head: Normocephalic and atraumatic.     Mouth/Throat:     Mouth: Mucous membranes are moist.     Pharynx: Oropharynx is clear. No oropharyngeal exudate or posterior oropharyngeal erythema.  Cardiovascular:     Rate and Rhythm: Normal rate and regular rhythm.  Pulmonary:     Effort: Pulmonary effort is normal. No respiratory distress.     Breath sounds: Wheezing (rare, expiratory) present. No rales.  Abdominal:     General: Abdomen is flat. Bowel sounds are normal. There is no distension.     Palpations: Abdomen is soft.     Tenderness: There is no abdominal tenderness.  Musculoskeletal:        General: Normal range of motion.     Right lower leg: No edema.     Left lower leg: No  edema.  Skin:    General: Skin is warm and dry.     Capillary Refill: Capillary refill takes less than 2 seconds.  Neurological:     General: No focal deficit present.     Mental Status: She is alert and oriented to person, place, and time.  Psychiatric:        Mood and Affect: Mood normal.        Behavior: Behavior normal.        07/20/2024    1:18 PM 04/20/2024    2:23 PM 03/17/2024    2:51 PM  Depression screen PHQ 2/9  Decreased Interest 0 0 0  Down, Depressed, Hopeless 0 0 0  PHQ - 2 Score 0 0 0  Altered sleeping  0 0  Tired, decreased energy  0 0  Change in appetite  0 0  Feeling bad or failure about yourself   0 0  Trouble concentrating  0 0  Moving slowly or  fidgety/restless  0 0  Suicidal thoughts  0 0  PHQ-9 Score  0  0   Difficult doing work/chores  Not difficult at all Not difficult at all     Data saved with a previous flowsheet row definition       07/20/2024    1:18 PM 04/20/2024    2:23 PM 07/08/2023    9:37 AM 06/17/2022    3:40 PM  GAD 7 : Generalized Anxiety Score  Nervous, Anxious, on Edge 0 0  0  0   Control/stop worrying 0 0  0  0   Worry too much - different things  0  0  0   Trouble relaxing  0  0  0   Restless  0  0  0   Easily annoyed or irritable  0  0  0   Afraid - awful might happen  0  0  0   Total GAD 7 Score  0 0 0  Anxiety Difficulty  Not difficult at all Not difficult at all Not difficult at all     Data saved with a previous flowsheet row definition    No results found for any visits on 07/20/24.    The 10-year ASCVD risk score (Arnett DK, et al., 2019) is: 5.7%    Assessment & Plan:  Emphysema lung (HCC) Assessment & Plan: Denies cough, dyspnea, or wheezing today. Reports hoarseness after trelegy. Voice is now back to normal prefer to be off inhaler has not having significant dyspnea. Continue to monitor symptoms.    Pure hypercholesterolemia Assessment & Plan: The 10-year ASCVD risk score (Arnett DK, et al., 2019) is: 5.7% .Lipid panel remains elevated despite lifestyle modifications start atorvastatin  10 mg daily.     Other orders -     Atorvastatin  Calcium ; Take 1 tablet (10 mg total) by mouth daily.  Dispense: 30 tablet; Refill: 2       Harlene Saddler, MD "

## 2024-07-20 NOTE — Assessment & Plan Note (Addendum)
 Denies cough, dyspnea, or wheezing today. Reports hoarseness after trelegy. Voice is now back to normal prefer to be off inhaler has not having significant dyspnea. Continue to monitor symptoms.

## 2024-07-20 NOTE — Assessment & Plan Note (Addendum)
 The 10-year ASCVD risk score (Arnett DK, et al., 2019) is: 5.7% .Lipid panel remains elevated despite lifestyle modifications start atorvastatin  10 mg daily.

## 2025-03-01 ENCOUNTER — Encounter: Admitting: Student

## 2025-03-23 ENCOUNTER — Ambulatory Visit
